# Patient Record
Sex: Female | Born: 1990 | Race: White | Hispanic: No | Smoking: Never smoker
Health system: Southern US, Community
[De-identification: ages and names within clinical notes are randomized; demographics above are authoritative.]

## PROBLEM LIST (undated history)

## (undated) DIAGNOSIS — T4145XA Adverse effect of unspecified anesthetic, initial encounter: Secondary | ICD-10-CM

## (undated) DIAGNOSIS — G43909 Migraine, unspecified, not intractable, without status migrainosus: Secondary | ICD-10-CM

## (undated) DIAGNOSIS — K760 Fatty (change of) liver, not elsewhere classified: Secondary | ICD-10-CM

## (undated) HISTORY — PX: CLAVICLE SURGERY: SHX598

---
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
## 2018-06-24 ENCOUNTER — Emergency Department (HOSPITAL_COMMUNITY)
Admission: EM | Admit: 2018-06-24 | Discharge: 2018-06-24 | Disposition: A | Discharge status: Home / Self Care | Payer: BLUE CROSS/BLUE SHIELD | Attending: Emergency Medicine | Admitting: Emergency Medicine

## 2018-06-24 ENCOUNTER — Emergency Department (HOSPITAL_COMMUNITY): Payer: BLUE CROSS/BLUE SHIELD

## 2018-06-24 ENCOUNTER — Other Ambulatory Visit: Payer: Self-pay

## 2018-06-24 ENCOUNTER — Encounter (HOSPITAL_COMMUNITY): Payer: Self-pay

## 2018-06-24 DIAGNOSIS — Z79899 Other long term (current) drug therapy: Secondary | ICD-10-CM | POA: Diagnosis not present

## 2018-06-24 DIAGNOSIS — K920 Hematemesis: Secondary | ICD-10-CM | POA: Diagnosis present

## 2018-06-24 HISTORY — DX: Fatty (change of) liver, not elsewhere classified: K76.0

## 2018-06-24 HISTORY — DX: Migraine, unspecified, not intractable, without status migrainosus: G43.909

## 2018-06-24 LAB — CBC
HCT: 44.7 % (ref 36.0–46.0)
Hemoglobin: 14.7 g/dL (ref 12.0–15.0)
MCH: 32.4 pg (ref 26.0–34.0)
MCHC: 32.9 g/dL (ref 30.0–36.0)
MCV: 98.5 fL (ref 80.0–100.0)
Platelets: 297 10*3/uL (ref 150–400)
RBC: 4.54 MIL/uL (ref 3.87–5.11)
RDW: 11.9 % (ref 11.5–15.5)
WBC: 14.2 10*3/uL — ABNORMAL HIGH (ref 4.0–10.5)
nRBC: 0 % (ref 0.0–0.2)

## 2018-06-24 LAB — COMPREHENSIVE METABOLIC PANEL
ALT: 17 U/L (ref 0–44)
ANION GAP: 7 (ref 5–15)
AST: 17 U/L (ref 15–41)
Albumin: 4 g/dL (ref 3.5–5.0)
Alkaline Phosphatase: 46 U/L (ref 38–126)
BUN: 10 mg/dL (ref 6–20)
CO2: 28 mmol/L (ref 22–32)
Calcium: 9.3 mg/dL (ref 8.9–10.3)
Chloride: 107 mmol/L (ref 98–111)
Creatinine, Ser: 0.61 mg/dL (ref 0.44–1.00)
GFR calc Af Amer: >60 mL/min (ref >60)
GLUCOSE: 88 mg/dL (ref 70–99)
Potassium: 4.2 mmol/L (ref 3.5–5.1)
Sodium: 142 mmol/L (ref 135–145)
TOTAL PROTEIN: 7.4 g/dL (ref 6.5–8.1)
Total Bilirubin: 0.5 mg/dL (ref 0.3–1.2)

## 2018-06-24 LAB — TYPE AND SCREEN
ABO/RH(D): A POS
Antibody Screen: NEGATIVE

## 2018-06-24 LAB — I-STAT BETA HCG BLOOD, ED (MC, WL, AP ONLY): I-stat hCG, quantitative: <5 m[IU]/mL (ref <5)

## 2018-06-24 MED ORDER — IOPAMIDOL (ISOVUE-300) INJECTION 61%
INTRAVENOUS | Status: AC
  Filled 2018-06-24: qty 100

## 2018-06-24 MED ORDER — SODIUM CHLORIDE (PF) 0.9 % IJ SOLN
INTRAMUSCULAR | Status: AC
  Filled 2018-06-24: qty 50

## 2018-06-24 MED ORDER — IOPAMIDOL (ISOVUE-300) INJECTION 61%
100.0000 mL | Freq: Once | INTRAVENOUS | Status: AC | PRN
  Administered 2018-06-24: 100 mL via INTRAVENOUS

## 2018-06-24 MED ORDER — ESOMEPRAZOLE MAGNESIUM 20 MG PO CPDR: 20.0000 mg | DELAYED_RELEASE_CAPSULE | Freq: Every day | ORAL | 0 refills | Status: DC

## 2018-06-24 NOTE — ED Triage Notes (Signed)
Pt has hx of fatty liver, when she would cough up blood on and off.  Pt states the last few months, she has had approx 3x week where she would throw up. Pt states that she has had blood in her emesis, but doesn't know if it's from her esophagus or from somewhere else.

## 2018-06-24 NOTE — Discharge Instructions (Addendum)
I have provided medication to help with your symptoms, please take one tablet daily for the next 30 days. I have also provided a referral to a gastroenterologist, please schedule an appointment with them at your earliest convenience.

## 2018-06-24 NOTE — ED Provider Notes (Signed)
[facility]-EMERGENCY DEPT Provider Note   CSN: [id number] Arrival date & time: 06/24/18  1703     History   Chief Complaint Chief Complaint  Patient presents with  . Hematemesis    HPI Sara Frost is a 27 y.o. female.  27 y.o female with a PMH of fatty liver presents to the ED with a chief complaint of hematemesis x 3 months. She reports she has a previous history of fatty liver was diagnosed at 89 but states she was seen multiple physicians and only getting pain medication to help with her symptoms.  She recently relocated to [facility] and obtain insurance therefore she decided it would be a good idea to come to the ED to get evaluated and further referral to a PCP.  She reports some lower quadrant abdominal pain radiating to her umbilical region, she reports it as pulling on her abdomen.  Not taking any therapy for this pain.  Also reports vomiting blood for the past couple months and now stating she feels burning sensation on her esophagus and does not know if she has ruptured it.  Denies any chest pain, shortness of breath, fever, headache.     Past Medical History:  Diagnosis Date  . Fatty liver   . Migraines     There are no active problems to display for this patient.   Past Surgical History:  Procedure Laterality Date  . CLAVICLE SURGERY       OB History   None      Home Medications    Prior to Admission medications   Medication Sig Start Date End Date Taking? Authorizing Provider  loratadine (CLARITIN) 10 MG tablet Take 10 mg by mouth daily as needed for allergies.   Yes [provider]  esomeprazole (NEXIUM) 20 MG capsule Take 1 capsule (20 mg total) by mouth daily. 06/24/18 07/24/18  Claude Manges, PA-C    Family History No family history on file.  Social History Social History   Tobacco Use  . Smoking status: Never Smoker  . Smokeless tobacco: Never Used  Substance Use Topics  . Alcohol use: Never    Frequency:  Never  . Drug use: Yes    Types: Marijuana     Allergies   Penicillins and Vicodin [hydrocodone-acetaminophen]   Review of Systems Review of Systems  Constitutional: Negative for fever.  HENT: Negative for sore throat.   Respiratory: Negative for shortness of breath.   Cardiovascular: Negative for chest pain.  Gastrointestinal: Positive for abdominal pain and vomiting. Negative for blood in stool.  Genitourinary: Negative for dysuria and flank pain.  Musculoskeletal: Negative for back pain.  Skin: Negative for pallor and wound.  Neurological: Negative for light-headedness and headaches.     Physical Exam Updated Vital Signs BP (!) 140/94   Pulse 72   Temp 97.8 F (36.6 C) (Oral)   Resp 18   Ht 5\' 8"  (1.727 m)   Wt 79.4 kg   LMP 06/14/2018   SpO2 100%   BMI 26.61 kg/m   Physical Exam  Constitutional: She is oriented to person, place, and time. She appears well-developed and well-nourished.  Neck: Normal range of motion. Neck supple.  Cardiovascular: Normal heart sounds.  Pulmonary/Chest: Effort normal.  Abdominal: Soft.  Neurological: She is alert and oriented to person, place, and time.  Skin: Skin is warm and dry.  Nursing note and vitals reviewed.    ED Treatments / Results  Labs (all labs ordered are listed, but only  abnormal results are displayed) Labs Reviewed  CBC - Abnormal; Notable for the following components:      Result Value   WBC 14.2 (*)    All other components within normal limits  COMPREHENSIVE METABOLIC PANEL  LIPASE, BLOOD  URINALYSIS, ROUTINE W REFLEX MICROSCOPIC  I-STAT BETA HCG BLOOD, ED ([facility], [facility], [facility] ONLY)  POC OCCULT BLOOD, ED  TYPE AND SCREEN  ABO/RH    EKG None  Radiology Ct Abdomen Pelvis W Contrast  Result Date: 06/24/2018 CLINICAL DATA:  Abdominal pain, acute, generalized.  Hematemesis. EXAM: CT ABDOMEN AND PELVIS WITH CONTRAST TECHNIQUE: Multidetector CT imaging of the abdomen and pelvis was performed using the  standard protocol following bolus administration of intravenous contrast. CONTRAST:  ISOVUE-300 IOPAMIDOL (ISOVUE-300) INJECTION 61% COMPARISON:  None. FINDINGS: Lower chest: The lung bases are clear without focal nodule, mass, or airspace disease. The heart size is normal. No significant pleural or pericardial effusion is present. Hepatobiliary: No focal liver abnormality is seen. No gallstones, gallbladder wall thickening, or biliary dilatation. Pancreas: Unremarkable. No pancreatic ductal dilatation or surrounding inflammatory changes. Spleen: Normal in size without focal abnormality. Adrenals/Urinary Tract: Adrenal glands are normal. Kidneys and ureters are within normal limits. There is no stone or mass lesion. The urinary bladder is within normal limits. Stomach/Bowel: Stomach and duodenum are within normal limits. The small bowel is unremarkable. Terminal ileum is within normal limits. The appendix is visualized and normal. The ascending and transverse colon are normal. Descending and sigmoid colon are within normal limits. Vascular/Lymphatic: No significant vascular findings are present. No enlarged abdominal or pelvic lymph nodes. Reproductive: Fluid or edema is present within the endometrial canal, likely physiologic. Uterus and adnexa are within normal limits. Other: Minimal free fluid is likely physiologic. No ventral hernia is present. Musculoskeletal: Vertebral body heights and alignment are normal in the lumbar spine. No acute or focal lesion is evident. Pelvis is within normal limits. The hips are located and normal. IMPRESSION: 1. Negative CT of the abdomen pelvis. No acute or focal lesion to explain patient's abdominal pain or hematemesis. Electronically Signed   By: Marin Roberts M.D.   On: 06/24/2018 22:29    Procedures Procedures (including critical care time)  Medications Ordered in ED Medications  iopamidol (ISOVUE-300) 61 % injection (has no administration in time range)    sodium chloride (PF) 0.9 % injection (has no administration in time range)  iopamidol (ISOVUE-300) 61 % injection 100 mL (100 mLs Intravenous Contrast Given 06/24/18 2208)     Initial Impression / Assessment and Plan / ED Course  I have reviewed the triage vital signs and the nursing notes.  Pertinent labs & imaging results that were available during my care of the patient were reviewed by me and considered in my medical decision making (see chart for details).    Presents with hematemesis for the past 3 months.  She reports she had a history of fatty liver she was 27 years old.  Today she reports no pain but dates she recently moved here from [place] and would like to set up primary care with gastroenterology follow-up.  She has not tried taking anything for her pain.  During examination patient is not distressed, she is texting and is not actively vomiting, no blood noted during exam. Blood work in the ED was unremarkable CMP showed no electrolyte abnormality, AST and ALT are within normal range low suspicion for any liver, gallbladder pathology.  CBC shows slight increase in her blood cells, consistent  with leukocytosis.  Lipase order, UA was not collected the patient denies any urinary symptoms, dysuria, hematuria.  Vitals have been stable during ED visit.  Due to patient's request I provided her with some fluids along with a CT abdomen and pelvis with contrast, this CT showed no acute abnormality, no gallbladder pathology, appendicitis, diverticulitis.  At this point have advised patient that I have no reasoning for her symptoms but I will provide her with a referral to gastroenterology to further evaluate her symptoms.  I have also given her some Nexium to take in order to help with the burning sensation while she eats.  She is vitals stable during ED visit, patient stable for discharge.  Final Clinical Impressions(s) / ED Diagnoses   Final diagnoses:  Hematemesis with nausea    ED  Discharge Orders         Ordered    esomeprazole (NEXIUM) 20 MG capsule  Daily     06/24/18 2240           Claude Manges, PA-C 06/24/18 2244    Charlynne Pander, MD 06/24/18 [phone]

## 2018-06-25 LAB — ABO/RH: ABO/RH(D): A POS

## 2018-06-29 ENCOUNTER — Encounter: Payer: Self-pay | Admitting: Gastroenterology

## 2018-07-23 ENCOUNTER — Encounter: Payer: Self-pay | Admitting: Gastroenterology

## 2018-07-23 ENCOUNTER — Ambulatory Visit: Payer: BLUE CROSS/BLUE SHIELD | Admitting: Gastroenterology

## 2018-07-23 VITALS — BP 140/80 | HR 86 | Ht 68.0 in | Wt 187.0 lb

## 2018-07-23 DIAGNOSIS — R112 Nausea with vomiting, unspecified: Secondary | ICD-10-CM | POA: Diagnosis not present

## 2018-07-23 DIAGNOSIS — K920 Hematemesis: Secondary | ICD-10-CM | POA: Diagnosis not present

## 2018-07-23 DIAGNOSIS — R1033 Periumbilical pain: Secondary | ICD-10-CM

## 2018-07-23 MED ORDER — ONDANSETRON HCL 4 MG PO TABS: 4.0000 mg | ORAL_TABLET | Freq: Three times a day (TID) | ORAL | 2 refills | Status: DC | PRN
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
## 2018-07-23 NOTE — Patient Instructions (Signed)
If you are age 565 or older, your body mass index should be between 23-30. Your Body mass index is 28.43 kg/m. If this is out of the aforementioned range listed, please consider follow up with your Primary Care Provider.  If you are age 25 or younger, your body mass index should be between 19-25. Your Body mass index is 28.43 kg/m. If this is out of the aformentioned range listed, please consider follow up with your Primary Care Provider.   You have been scheduled for an endoscopy. Please follow written instructions given to you at your visit today. If you use inhalers (even only as needed), please bring them with you on the day of your procedure. Your physician has requested that you go to www.startemmi.com and enter the access code given to you at your visit today. This web site gives a general overview about your procedure. However, you should still follow specific instructions given to you by our office regarding your preparation for the procedure.  It was a pleasure to see you today!  Dr. Myrtie Neitheranis

## 2018-08-04 ENCOUNTER — Encounter: Payer: Self-pay | Admitting: Gastroenterology

## 2018-08-04 ENCOUNTER — Ambulatory Visit (AMBULATORY_SURGERY_CENTER): Payer: BLUE CROSS/BLUE SHIELD | Admitting: Gastroenterology

## 2018-08-04 VITALS — BP 112/65 | HR 66 | Temp 97.8°F | Resp 17 | Ht 68.0 in | Wt 187.0 lb

## 2018-08-04 DIAGNOSIS — R112 Nausea with vomiting, unspecified: Secondary | ICD-10-CM

## 2018-08-04 DIAGNOSIS — K2970 Gastritis, unspecified, without bleeding: Secondary | ICD-10-CM | POA: Diagnosis present

## 2018-08-04 DIAGNOSIS — K2950 Unspecified chronic gastritis without bleeding: Secondary | ICD-10-CM

## 2018-08-04 MED ORDER — SODIUM CHLORIDE 0.9 % IV SOLN: 500.0000 mL | Freq: Once | INTRAVENOUS | Status: DC

## 2018-08-04 NOTE — Op Note (Signed)
[facility] Patient Name: Sara Frost Procedure Date: 08/04/2018 9:23 AM MRN: [id number] Endoscopist: Sherilyn Cooter L. Myrtie Neither , MD Age: 27 Referring MD:  Date of Birth: [birth year] Gender: Female Account #: [id number] Procedure:                Upper GI endoscopy Indications:              Persistent vomiting (cyclical for 10 years) Medicines:                Monitored Anesthesia Care Procedure:                Pre-Anesthesia Assessment:                           - Prior to the procedure, a History and Physical                            was performed, and patient medications and                            allergies were reviewed. The patient's tolerance of                            previous anesthesia was also reviewed. The risks                            and benefits of the procedure and the sedation                            options and risks were discussed with the patient.                            All questions were answered, and informed consent                            was obtained. Prior Anticoagulants: The patient has                            taken no previous anticoagulant or antiplatelet                            agents. ASA Grade Assessment: I - A normal, healthy                            patient. After reviewing the risks and benefits,                            the patient was deemed in satisfactory condition to                            undergo the procedure.                           After obtaining informed consent, the endoscope was  passed under direct vision. Throughout the                            procedure, the patient's blood pressure, pulse, and                            oxygen saturations were monitored continuously. The                            Endoscope was introduced through the mouth, and                            advanced to the second part of duodenum. The upper                            GI endoscopy was  accomplished without difficulty.                            The patient tolerated the procedure well. Scope In: Scope Out: Findings:                 LA Grade A (one or more mucosal breaks less than 5                            mm, not extending between tops of 2 mucosal folds)                            esophagitis was found at the gastroesophageal                            junction.                           The entire examined stomach was normal. Biopsies                            were taken with a cold forceps for histology.                            (Sidney protocol).                           The cardia and gastric fundus were normal on                            retroflexion.                           The examined duodenum was normal. Complications:            No immediate complications. Estimated Blood Loss:     Estimated blood loss was minimal. Impression:               - LA Grade A esophagitis (result of vomiting)                           -  Normal stomach. Biopsied.                           - Normal examined duodenum.                           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 Recommendation:           - Patient has a contact number available for                            emergencies. The signs and symptoms of potential                            delayed complications were discussed with the                            patient. Return to normal activities tomorrow.                            Written discharge instructions were provided to the                            patient.                           - Resume previous diet.                           - Continue present medications.                           - Await pathology results.                           - Zofran was recently prescribed - patient is                            considering a trial of it. [name], MD 08/04/2018 9:45:00 AM This  report has been signed electronically.

## 2018-08-04 NOTE — Patient Instructions (Signed)
  Await pathology results.  Trial of Zofran to be considered.  YOU HAD AN ENDOSCOPIC PROCEDURE TODAY AT THE [facility]:   Refer to the procedure report that was given to you for any specific questions about what was found during the examination.  If the procedure report does not answer your questions, please call your gastroenterologist to clarify.  If you requested that your care partner not be given the details of your procedure findings, then the procedure report has been included in a sealed envelope for you to review at your convenience later.  YOU SHOULD EXPECT: Some feelings of bloating in the abdomen. Passage of more gas than usual.  Walking can help get rid of the air that was put into your GI tract during the procedure and reduce the bloating. If you had a lower endoscopy (such as a colonoscopy or flexible sigmoidoscopy) you may notice spotting of blood in your stool or on the toilet paper. If you underwent a bowel prep for your procedure, you may not have a normal bowel movement for a few days.  Please Note:  You might notice some irritation and congestion in your nose or some drainage.  This is from the oxygen used during your procedure.  There is no need for concern and it should clear up in a day or so.  SYMPTOMS TO REPORT IMMEDIATELY:     Following upper endoscopy (EGD)  Vomiting of blood or coffee ground material  New chest pain or pain under the shoulder blades  Painful or persistently difficult swallowing  New shortness of breath  Fever of 100F or higher  Black, tarry-looking stools  For urgent or emergent issues, a gastroenterologist can be reached at any hour by calling [phone].   DIET:  We do recommend a small meal at first, but then you may proceed to your regular diet.  Drink plenty of fluids but you should avoid alcoholic beverages for 24 hours.  ACTIVITY:  You should plan to take it easy for the rest of today and you should NOT DRIVE or use  heavy machinery until tomorrow (because of the sedation medicines used during the test).    FOLLOW UP: Our staff will call the number listed on your records the next business day following your procedure to check on you and address any questions or concerns that you may have regarding the information given to you following your procedure. If we do not reach you, we will leave a message.  However, if you are feeling well and you are not experiencing any problems, there is no need to return our call.  We will assume that you have returned to your regular daily activities without incident.  If any biopsies were taken you will be contacted by phone or by letter within the next 1-3 weeks.  Please call us at [phone] if you have not heard about the biopsies in 3 weeks.    SIGNATURES/CONFIDENTIALITY: You and/or your care partner have signed paperwork which will be entered into your electronic medical record.  These signatures attest to the fact that that the information above on your After Visit Summary has been reviewed and is understood.  Full responsibility of the confidentiality of this discharge information lies with you and/or your care-partner.

## 2018-08-04 NOTE — Progress Notes (Signed)
Pt's states no medical or surgical changes since previsit or office visit. 

## 2018-08-04 NOTE — Progress Notes (Signed)
Pt awake VSS report to RN, no anesthetic complications noted

## 2018-08-05 ENCOUNTER — Telehealth: Payer: Self-pay | Admitting: *Deleted

## 2018-08-05 ENCOUNTER — Telehealth: Payer: Self-pay

## 2018-08-05 NOTE — Telephone Encounter (Signed)
Second phone call attempt, no answer, unable to leave message.

## 2018-09-08 ENCOUNTER — Ambulatory Visit: Payer: BLUE CROSS/BLUE SHIELD | Admitting: Gastroenterology

## 2018-09-08 ENCOUNTER — Encounter: Payer: Self-pay | Admitting: Gastroenterology

## 2018-09-08 VITALS — BP 116/70 | HR 72 | Ht 72.0 in | Wt 178.0 lb

## 2018-09-08 DIAGNOSIS — R1010 Upper abdominal pain, unspecified: Secondary | ICD-10-CM | POA: Diagnosis not present

## 2018-09-08 DIAGNOSIS — R112 Nausea with vomiting, unspecified: Secondary | ICD-10-CM | POA: Diagnosis not present

## 2018-09-08 NOTE — Progress Notes (Deleted)
     [facility] Progress Note  Chief Complaint: Nausea and vomiting  Subjective  History:  Lidia recently came to see me for many years of cyclic vomiting.  Recent upper endoscopy was normal, and biopsies negative for H. pylori.  At the time of initial visit, requested a prescription for Zofran since this had well at times in the past.  ROS: Cardiovascular:  no chest pain Respiratory: no dyspnea  The patient's Past Medical, Family and Social History were reviewed and are on file in the EMR.  Objective:  Med list reviewed  Current Outpatient Medications:  .  loratadine (CLARITIN) 10 MG tablet, Take 10 mg by mouth daily as needed for allergies., Disp: , Rfl:  .  ondansetron (ZOFRAN) 4 MG tablet, Take 1 tablet (4 mg total) by mouth every 8 (eight) hours as needed for nausea or vomiting. Please prescribe oral disintegrating tablet, Disp: 45 tablet, Rfl: 2   Vital signs in last 24 hrs: There were no vitals filed for this visit.  Physical Exam  ***  HEENT: sclera anicteric, oral mucosa moist without lesions  Neck: supple, no thyromegaly, JVD or lymphadenopathy  Cardiac: RRR without murmurs, S1S2 heard, no peripheral edema  Pulm: clear to auscultation bilaterally, normal RR and effort noted  Abdomen: soft, *** tenderness, with active bowel sounds. No guarding or palpable hepatosplenomegaly.  Skin; warm and dry, no jaundice or rash  Recent Labs:    Radiologic studies:    @ASSESSMENTPLANBEGIN @ Assessment: No diagnosis found.    Plan:    Total time *** minutes, over half spent face-to-face with patient in counseling and coordination of care.   Charlie Pitter III

## 2018-09-08 NOTE — Progress Notes (Signed)
     [facility] Progress Note  Chief Complaint: Nausea and vomiting  Subjective  History:  Sara Frost follows up after recent upper endoscopy.  I saw her in early December for longstanding episodic nausea and vomiting with upper abdominal pain, most consistent with cyclic vomiting syndrome.  Recent upper endoscopy was normal, H. pylori CLO biopsy negative.  She has been taking Zofran as needed, which helps to relieve the severity duration of episodes, still occur 3-4 times a week.  It seems to most often occur in the morning, which she will vomit several times with crampy upper abdominal pain, much less frequently in the evening.  Between episodes she feels fine with no abdominal pain or nausea.  ROS: Cardiovascular:  no chest pain Respiratory: no dyspnea  The patient's Past Medical, Family and Social History were reviewed and are on file in the EMR.  Objective:  Med list reviewed  Current Outpatient Medications:  .  loratadine (CLARITIN) 10 MG tablet, Take 10 mg by mouth daily as needed for allergies., Disp: , Rfl:  .  ondansetron (ZOFRAN) 4 MG tablet, Take 1 tablet (4 mg total) by mouth every 8 (eight) hours as needed for nausea or vomiting. Please prescribe oral disintegrating tablet, Disp: 45 tablet, Rfl: 2   Vital signs in last 24 hrs: Vitals:   09/08/18 1402  BP: 116/70  Pulse: 72    Physical Exam  Well-appearing  HEENT: sclera anicteric, oral mucosa moist without lesions  Neck: supple, no thyromegaly, JVD or lymphadenopathy  Cardiac: RRR without murmurs, S1S2 heard, no peripheral edema  Pulm: clear to auscultation bilaterally, normal RR and effort noted  Abdomen: soft, no tenderness, with active bowel sounds. No guarding or palpable hepatosplenomegaly.  Recent Labs:  Gastric biopsy negative for H. pylori As noted in initial consult note, she had a CT scan of the abdomen during ED visit for the symptoms in early November, which was a normal study.   Specifically, no areas of inflammation or obstruction were seen.  @ASSESSMENTPLANBEGIN @ Assessment: Encounter Diagnoses  Name Primary?  . Nausea and vomiting in adult Yes  . Upper abdominal pain    This is behaving most like cyclic vomiting syndrome.  We again discussed the unclear nature of that condition and its limited available treatments.  I recommended a trial of metoclopramide 5 mg every morning, with another dose later in the day if needed.  We discussed the possible side effects including tardive dyskinesia.  She wrote down this information and would like to give it some more consideration.  I also again brought up the possibility of consultation at the [facility] GI motility clinic to consider EGG.  If it came to that, they would also require a 4-hour gastric emptying study to be done prior to their clinic visit.  Sara Frost would like to consider it further and I asked her to contact me when she makes a decision.    Total time 20 minutes, over half spent face-to-face with patient in counseling and coordination of care.   Charlie Pitter III

## 2018-09-08 NOTE — Patient Instructions (Addendum)
Please call us when you decide if you would like a trial of metoclopramide.  If you are age 26 or older, your body mass index should be between 23-30. Your Body mass index is 24.14 kg/m. If this is out of the aforementioned range listed, please consider follow up with your Primary Care Provider.  If you are age 8 or younger, your body mass index should be between 19-25. Your Body mass index is 24.14 kg/m. If this is out of the aformentioned range listed, please consider follow up with your Primary Care Provider.   It was a pleasure to see you today!  Dr. Myrtie Neither

## 2018-09-23 ENCOUNTER — Inpatient Hospital Stay (HOSPITAL_COMMUNITY)
Admission: AD | Admit: 2018-09-23 | Discharge: 2018-09-23 | Disposition: A | Discharge status: Home / Self Care | Payer: BLUE CROSS/BLUE SHIELD | Attending: Obstetrics and Gynecology | Admitting: Obstetrics and Gynecology

## 2018-09-23 ENCOUNTER — Encounter (HOSPITAL_COMMUNITY): Payer: Self-pay | Admitting: *Deleted

## 2018-09-23 ENCOUNTER — Other Ambulatory Visit: Payer: Self-pay

## 2018-09-23 DIAGNOSIS — N939 Abnormal uterine and vaginal bleeding, unspecified: Secondary | ICD-10-CM | POA: Diagnosis present

## 2018-09-23 DIAGNOSIS — Z880 Allergy status to penicillin: Secondary | ICD-10-CM | POA: Insufficient documentation

## 2018-09-23 DIAGNOSIS — Z3202 Encounter for pregnancy test, result negative: Secondary | ICD-10-CM | POA: Diagnosis not present

## 2018-09-23 LAB — URINALYSIS, ROUTINE W REFLEX MICROSCOPIC

## 2018-09-23 LAB — CBC
HCT: 42.9 % (ref 36.0–46.0)
Hemoglobin: 14.9 g/dL (ref 12.0–15.0)
MCH: 32.5 pg (ref 26.0–34.0)
MCHC: 34.7 g/dL (ref 30.0–36.0)
MCV: 93.5 fL (ref 80.0–100.0)
Platelets: 239 10*3/uL (ref 150–400)
RBC: 4.59 MIL/uL (ref 3.87–5.11)
RDW: 12.3 % (ref 11.5–15.5)
WBC: 13.3 10*3/uL — ABNORMAL HIGH (ref 4.0–10.5)
nRBC: 0 % (ref 0.0–0.2)

## 2018-09-23 LAB — URINALYSIS, MICROSCOPIC (REFLEX): RBC / HPF: >50 RBC/hpf (ref 0–5)

## 2018-09-23 LAB — POCT PREGNANCY, URINE: Preg Test, Ur: NEGATIVE

## 2018-09-23 LAB — ABO/RH: ABO/RH(D): A POS

## 2018-09-23 LAB — HCG, QUANTITATIVE, PREGNANCY: hCG, Beta Chain, Quant, S: <1 m[IU]/mL (ref <5)

## 2018-09-23 NOTE — MAU Note (Signed)
Took 8 preg test about a month ago and they were all positive.  Started bleeding together. Cramping. Co-workers thought she might be having a miscarriage.

## 2018-09-23 NOTE — Discharge Instructions (Signed)
Abnormal Uterine Bleeding  Abnormal uterine bleeding means bleeding more than usual from your uterus. It can include:   Bleeding between periods.   Bleeding after sex.   Bleeding that is heavier than normal.   Periods that last longer than usual.   Bleeding after you have stopped having your period (menopause).  There are many problems that may cause this. You should see a doctor for any kind of bleeding that is not normal. Treatment depends on the cause of the bleeding.  Follow these instructions at home:   Watch your condition for any changes.   Do not use tampons, douche, or have sex, if your doctor tells you not to.   Change your pads often.   Get regular well-woman exams. Make sure they include a pelvic exam and cervical cancer screening.   Keep all follow-up visits as told by your doctor. This is important.  Contact a doctor if:   The bleeding lasts more than one week.   You feel dizzy at times.   You feel like you are going to throw up (nauseous).   You throw up.  Get help right away if:   You pass out.   You have to change pads every hour.   You have belly (abdominal) pain.   You have a fever.   You get sweaty.   You get weak.   You passing large blood clots from your vagina.  Summary   Abnormal uterine bleeding means bleeding more than usual from your uterus.   There are many problems that may cause this. You should see a doctor for any kind of bleeding that is not normal.   Treatment depends on the cause of the bleeding.  This information is not intended to replace advice given to you by your health care provider. Make sure you discuss any questions you have with your health care provider.  Document Released: 06/01/2009 Document Revised: 07/29/2016 Document Reviewed: 07/29/2016  Elsevier Interactive Patient Education  2019 [employer]

## 2018-09-23 NOTE — MAU Provider Note (Signed)
History     CSN: [id number]  Arrival date and time: 09/23/18 1308   First Provider Initiated Contact with Patient 09/23/18 1340      Chief Complaint  Patient presents with  . Vaginal Bleeding  . Abdominal Pain  . Possible Pregnancy   HPI Sara Frost is a 27 y.o. G0P0 patient who presents to MAU with chief complaint of heavy vaginal bleeding in the setting of multiple home pregnancy tests.  Patient endorses new onset lower abdominal pain at work. She says "the girls at work" saw the bleeding and told her she should probably come to MAU for evaluation.  Patient endorses LMP of 07/02/2018 but also says she had multiple days of spotting in November and at the end of December. She also endorses lower back cramping for the past two weeks. Patient reports intermittent breast tenderness but is not sure of timing in correlation to her menstrual cycle. She states she has never had irregular periods or PMS before now.  OB History   No obstetric history on file.     Past Medical History:  Diagnosis Date  . Fatty liver   . Migraines     Past Surgical History:  Procedure Laterality Date  . CLAVICLE SURGERY      Family History  Problem Relation Age of Onset  . Lung cancer Mother   . Liver cancer Maternal Grandmother   . Pancreatic cancer Paternal Grandfather     Social History   Tobacco Use  . Smoking status: Never Smoker  . Smokeless tobacco: Never Used  Substance Use Topics  . Alcohol use: Never    Frequency: Never  . Drug use: Yes    Types: Marijuana    Comment: last used last night 08/04/18    Allergies:  Allergies  Allergen Reactions  . Penicillins Nausea Only    Has patient had a PCN reaction causing immediate rash, facial/tongue/throat swelling, SOB or lightheadedness with hypotension: Y Has patient had a PCN reaction causing severe rash involving mucus membranes or skin necrosis: Y Has patient had a PCN reaction that required hospitalization: N Has patient  had a PCN reaction occurring within the last 10 years: Y If all of the above answers are "NO", then may proceed with Cephalosporin use.   . Vicodin [Hydrocodone-Acetaminophen] Nausea And Vomiting    Medications Prior to Admission  Medication Sig Dispense Refill Last Dose  . loratadine (CLARITIN) 10 MG tablet Take 10 mg by mouth daily as needed for allergies.   Unknown  . ondansetron (ZOFRAN) 4 MG tablet Take 1 tablet (4 mg total) by mouth every 8 (eight) hours as needed for nausea or vomiting. Please prescribe oral disintegrating tablet 45 tablet 2 Unknown    Review of Systems  Constitutional: Negative for chills, fatigue and fever.  Respiratory: Negative for shortness of breath.   Gastrointestinal: Positive for abdominal pain.  Genitourinary: Positive for vaginal bleeding. Negative for flank pain.  Musculoskeletal: Positive for back pain.  Neurological: Negative for dizziness, weakness and headaches.  All other systems reviewed and are negative.  Physical Exam   Blood pressure (!) 157/69, pulse 69, temperature 98.4 F (36.9 C), temperature source Oral, resp. rate 18, height 5\' 7"  (1.702 m), weight 80.9 kg, last menstrual period 07/19/2018, SpO2 100 %.  Physical Exam  Nursing note and vitals reviewed. Constitutional: She is oriented to person, place, and time. She appears well-developed and well-nourished.  Cardiovascular: Normal rate.  Respiratory: Effort normal. No respiratory distress.  GI: Soft. She exhibits  no distension. There is no abdominal tenderness. There is no rebound and no guarding.  Neurological: She is alert and oriented to person, place, and time.  Skin: Skin is warm and dry.  Psychiatric: She has a normal mood and affect. Her behavior is normal. Judgment and thought content normal.    MAU Course/MDM   --Patient's triage tab from endoscopy evaluation endorse regular period in mid-January --Negligible bleeding in MAU --Discussed with patient that based on her  physical exam, Negative urine pregnancy test and Quant hCG of <1 my suspicion for recent miscarriage is low and I do not feel an inpatient ultrasound is indicated.   Patient Vitals for the past 24 hrs:  BP Temp Temp src Pulse Resp SpO2 Height Weight  09/23/18 1533 (!) 156/89 - - (!) 58 16 - - -  09/23/18 1322 (!) 157/69 98.4 F (36.9 C) Oral 69 18 100 % 5\' 7"  (1.702 m) 80.9 kg    Results for orders placed or performed during the hospital encounter of 09/23/18 (from the past 24 hour(s))  Urinalysis, Routine w reflex microscopic     Status: Abnormal   Collection Time: 09/23/18  1:31 PM  Result Value Ref Range   Color, Urine RED (A) YELLOW   APPearance HAZY (A) CLEAR   Specific Gravity, Urine  1.005 - 1.030    TEST NOT REPORTED DUE TO COLOR INTERFERENCE OF URINE PIGMENT   pH  5.0 - 8.0    TEST NOT REPORTED DUE TO COLOR INTERFERENCE OF URINE PIGMENT   Glucose, UA (A) NEGATIVE mg/dL    TEST NOT REPORTED DUE TO COLOR INTERFERENCE OF URINE PIGMENT   Hgb urine dipstick (A) NEGATIVE    TEST NOT REPORTED DUE TO COLOR INTERFERENCE OF URINE PIGMENT   Bilirubin Urine (A) NEGATIVE    TEST NOT REPORTED DUE TO COLOR INTERFERENCE OF URINE PIGMENT   Ketones, ur (A) NEGATIVE mg/dL    TEST NOT REPORTED DUE TO COLOR INTERFERENCE OF URINE PIGMENT   Protein, ur (A) NEGATIVE mg/dL    TEST NOT REPORTED DUE TO COLOR INTERFERENCE OF URINE PIGMENT   Nitrite (A) NEGATIVE    TEST NOT REPORTED DUE TO COLOR INTERFERENCE OF URINE PIGMENT   Leukocytes, UA (A) NEGATIVE    TEST NOT REPORTED DUE TO COLOR INTERFERENCE OF URINE PIGMENT  Urinalysis, Microscopic (reflex)     Status: Abnormal   Collection Time: 09/23/18  1:31 PM  Result Value Ref Range   RBC / HPF >50 0 - 5 RBC/hpf   WBC, UA 0-5 0 - 5 WBC/hpf   Bacteria, UA MANY (A) NONE SEEN   Squamous Epithelial / LPF 0-5 0 - 5   Mucus PRESENT   Pregnancy, urine POC     Status: None   Collection Time: 09/23/18  1:33 PM  Result Value Ref Range   Preg Test, Ur  NEGATIVE NEGATIVE  CBC     Status: Abnormal   Collection Time: 09/23/18  1:42 PM  Result Value Ref Range   WBC 13.3 (H) 4.0 - 10.5 K/uL   RBC 4.59 3.87 - 5.11 MIL/uL   Hemoglobin 14.9 12.0 - 15.0 g/dL   HCT 25.0 03.7 - 04.8 %   MCV 93.5 80.0 - 100.0 fL   MCH 32.5 26.0 - 34.0 pg   MCHC 34.7 30.0 - 36.0 g/dL   RDW 88.9 16.9 - 45.0 %   Platelets 239 150 - 400 K/uL   nRBC 0.0 0.0 - 0.2 %  hCG, quantitative, pregnancy  Status: None   Collection Time: 09/23/18  1:42 PM  Result Value Ref Range   hCG, Beta Chain, Quant, S <1 <5 mIU/mL  ABO/Rh     Status: None   Collection Time: 09/23/18  1:42 PM  Result Value Ref Range   ABO/RH(D)      A POS Performed at [facility], [address] [place], [place] [id number]      Assessment and Plan  --28 y.o. G0P0, irregular vaginal bleeding --Quant hCG <1 --Discharge home in stable condition  F/U: PRN with PCP or establish GYN care for closer follow up  Calvert CantorSamantha C Helia Haese, CNM 09/23/2018, 3:52 PM

## 2019-02-18 ENCOUNTER — Emergency Department (HOSPITAL_COMMUNITY)
Admission: EM | Admit: 2019-02-18 | Discharge: 2019-02-19 | Disposition: A | Discharge status: Home / Self Care | Payer: Self-pay | Attending: Emergency Medicine | Admitting: Emergency Medicine

## 2019-02-18 ENCOUNTER — Emergency Department (HOSPITAL_COMMUNITY): Payer: Self-pay

## 2019-02-18 ENCOUNTER — Other Ambulatory Visit: Payer: Self-pay

## 2019-02-18 ENCOUNTER — Encounter (HOSPITAL_COMMUNITY): Payer: Self-pay | Admitting: Emergency Medicine

## 2019-02-18 DIAGNOSIS — Z79899 Other long term (current) drug therapy: Secondary | ICD-10-CM | POA: Insufficient documentation

## 2019-02-18 DIAGNOSIS — Y9351 Activity, roller skating (inline) and skateboarding: Secondary | ICD-10-CM | POA: Insufficient documentation

## 2019-02-18 DIAGNOSIS — Y999 Unspecified external cause status: Secondary | ICD-10-CM | POA: Insufficient documentation

## 2019-02-18 DIAGNOSIS — Y929 Unspecified place or not applicable: Secondary | ICD-10-CM | POA: Insufficient documentation

## 2019-02-18 DIAGNOSIS — S52502A Unspecified fracture of the lower end of left radius, initial encounter for closed fracture: Secondary | ICD-10-CM | POA: Insufficient documentation

## 2019-02-18 MED ORDER — OXYCODONE-ACETAMINOPHEN 5-325 MG PO TABS: 1.0000 | ORAL_TABLET | ORAL | 0 refills | Status: DC | PRN

## 2019-02-18 NOTE — ED Triage Notes (Signed)
Patient presents with left wrist pain/swelling injured this evening when she fell while skating , no LOC/ambulatory .

## 2019-02-18 NOTE — ED Provider Notes (Signed)
[facility] EMERGENCY DEPARTMENT Provider Note   CSN: [id number] Arrival date & time: 02/18/19  2204     History   Chief Complaint Chief Complaint  Patient presents with  . Wrist Injury    HPI Sara Frost is a 28 y.o. female.     The history is provided by the patient. No language interpreter was used.  Wrist Injury Location:  Wrist Wrist location:  L wrist Injury: yes   Mechanism of injury: fall   Fall:    Entrapped after fall: no   Pain details:    Quality:  Aching   Severity:  Moderate   Timing:  Constant   Progression:  Worsening Dislocation: no   Relieved by:  Nothing Worsened by:  Nothing Ineffective treatments:  None tried Pt reports she fell while roller skating.  Pt landed with hand out.  Pt complains of pain in her wrist.   Past Medical History:  Diagnosis Date  . Fatty liver   . Migraines     There are no active problems to display for this patient.   Past Surgical History:  Procedure Laterality Date  . CLAVICLE SURGERY       OB History   No obstetric history on file.      Home Medications    Prior to Admission medications   Medication Sig Start Date End Date Taking? Authorizing Provider  loratadine (CLARITIN) 10 MG tablet Take 10 mg by mouth daily as needed for allergies.    [provider]  ondansetron (ZOFRAN) 4 MG tablet Take 1 tablet (4 mg total) by mouth every 8 (eight) hours as needed for nausea or vomiting. Please prescribe oral disintegrating tablet 07/23/18   Doran Stabler, MD    Family History Family History  Problem Relation Age of Onset  . Lung cancer Mother   . Liver cancer Maternal Grandmother   . Pancreatic cancer Paternal Grandfather     Social History Social History   Tobacco Use  . Smoking status: Never Smoker  . Smokeless tobacco: Never Used  Substance Use Topics  . Alcohol use: Never    Frequency: Never  . Drug use: Yes    Types: Marijuana    Comment: last used last  night 08/04/18     Allergies   Penicillins and Vicodin [hydrocodone-acetaminophen]   Review of Systems Review of Systems  All other systems reviewed and are negative.    Physical Exam Updated Vital Signs BP (!) 146/102 (BP Location: Left Arm)   Pulse 62   Temp 98.4 F (36.9 C) (Oral)   Resp 18   LMP 02/04/2019 (Approximate)   SpO2 100%   Physical Exam Vitals signs reviewed.  HENT:     Head: Normocephalic.  Musculoskeletal:        General: Swelling and tenderness present.     Comments: nv and ns intact,  From   Skin:    General: Skin is warm.  Neurological:     General: No focal deficit present.     Mental Status: She is alert.  Psychiatric:        Mood and Affect: Mood normal.      ED Treatments / Results  Labs (all labs ordered are listed, but only abnormal results are displayed) Labs Reviewed - No data to display  EKG None  Radiology Dg Wrist Complete Left  Result Date: 02/18/2019 CLINICAL DATA:  Pain after fall. EXAM: LEFT WRIST - COMPLETE 3+ VIEW COMPARISON:  None. FINDINGS: There  is a fracture through the ulnar styloid. There is a mildly displaced comminuted fracture through the distal radius. No dislocation. Soft tissue swelling is seen over the dorsum of the wrist. No other acute abnormalities. IMPRESSION: Comminuted displaced fracture through the distal radius. Ulnar styloid fracture. Soft tissue swelling. Electronically Signed   By: Gerome Samavid  Williams III M.D   On: 02/18/2019 22:57    Procedures Procedures (including critical care time)  Medications Ordered in ED Medications - No data to display   Initial Impression / Assessment and Plan / ED Course  I have reviewed the triage vital signs and the nursing notes.  Pertinent labs & imaging results that were available during my care of the patient were reviewed by me and considered in my medical decision making (see chart for details).        MDM  Pt placed in a sugar tong splint.  Pt placed in  a sling.  I spoke to Dr. Merlyn LotKuzma who advised he will see this week for evaluation   Final Clinical Impressions(s) / ED Diagnoses   Final diagnoses:  Closed fracture of distal end of left radius, unspecified fracture morphology, initial encounter    ED Discharge Orders         Ordered    oxyCODONE-acetaminophen (PERCOCET) 5-325 MG tablet  Every 4 hours PRN     02/18/19 2359        An After Visit Summary was printed and given to the patient.    Elson AreasSofia, Leslie K, PA-C 02/19/19 0000    Maia PlanLong, Joshua G, MD 02/19/19 1256

## 2019-02-19 MED ORDER — OXYCODONE-ACETAMINOPHEN 5-325 MG PO TABS
1.0000 | ORAL_TABLET | Freq: Once | ORAL | Status: AC
  Administered 2019-02-19: 1 via ORAL
  Filled 2019-02-19: qty 1

## 2019-02-19 NOTE — Progress Notes (Signed)
Orthopedic Tech Progress Note Patient Details:  Sara Frost 08/28/1990 [id number]  Ortho Devices Type of Ortho Device: Short arm splint Ortho Device/Splint Interventions: Adjustment, Application, Ordered   Post Interventions Patient Tolerated: Well Instructions Provided: Care of device, Adjustment of device   Melony Overly T 02/19/2019, 12:11 AM

## 2019-02-22 ENCOUNTER — Other Ambulatory Visit (HOSPITAL_COMMUNITY)
Admission: RE | Admit: 2019-02-22 | Discharge: 2019-02-22 | Disposition: A | Discharge status: Home / Self Care | Payer: HRSA Program | Source: Ambulatory Visit | Attending: Orthopedic Surgery | Admitting: Orthopedic Surgery

## 2019-02-22 ENCOUNTER — Other Ambulatory Visit: Payer: Self-pay | Admitting: Orthopedic Surgery

## 2019-02-22 ENCOUNTER — Other Ambulatory Visit: Payer: Self-pay

## 2019-02-22 ENCOUNTER — Encounter (HOSPITAL_BASED_OUTPATIENT_CLINIC_OR_DEPARTMENT_OTHER): Payer: Self-pay | Admitting: *Deleted

## 2019-02-22 DIAGNOSIS — Z01812 Encounter for preprocedural laboratory examination: Secondary | ICD-10-CM | POA: Diagnosis present

## 2019-02-22 DIAGNOSIS — Z1159 Encounter for screening for other viral diseases: Secondary | ICD-10-CM | POA: Diagnosis not present

## 2019-02-22 LAB — SARS CORONAVIRUS 2 (TAT 6-24 HRS): SARS Coronavirus 2: NEGATIVE

## 2019-02-24 ENCOUNTER — Ambulatory Visit (HOSPITAL_BASED_OUTPATIENT_CLINIC_OR_DEPARTMENT_OTHER)
Admission: RE | Admit: 2019-02-24 | Discharge: 2019-02-24 | Disposition: A | Discharge status: Home / Self Care | Payer: Self-pay | Attending: Orthopedic Surgery | Admitting: Orthopedic Surgery

## 2019-02-24 ENCOUNTER — Ambulatory Visit (HOSPITAL_BASED_OUTPATIENT_CLINIC_OR_DEPARTMENT_OTHER): Payer: Self-pay | Admitting: Anesthesiology

## 2019-02-24 ENCOUNTER — Encounter (HOSPITAL_BASED_OUTPATIENT_CLINIC_OR_DEPARTMENT_OTHER)
Admission: RE | Disposition: A | Discharge status: Home / Self Care | Payer: Self-pay | Source: Home / Self Care | Attending: Orthopedic Surgery

## 2019-02-24 ENCOUNTER — Encounter (HOSPITAL_BASED_OUTPATIENT_CLINIC_OR_DEPARTMENT_OTHER): Payer: Self-pay | Admitting: Anesthesiology

## 2019-02-24 ENCOUNTER — Other Ambulatory Visit: Payer: Self-pay

## 2019-02-24 DIAGNOSIS — S52572A Other intraarticular fracture of lower end of left radius, initial encounter for closed fracture: Secondary | ICD-10-CM | POA: Insufficient documentation

## 2019-02-24 DIAGNOSIS — Y9351 Activity, roller skating (inline) and skateboarding: Secondary | ICD-10-CM | POA: Insufficient documentation

## 2019-02-24 DIAGNOSIS — Z880 Allergy status to penicillin: Secondary | ICD-10-CM | POA: Insufficient documentation

## 2019-02-24 DIAGNOSIS — Z801 Family history of malignant neoplasm of trachea, bronchus and lung: Secondary | ICD-10-CM | POA: Insufficient documentation

## 2019-02-24 DIAGNOSIS — Z800 Family history of malignant neoplasm of digestive organs: Secondary | ICD-10-CM | POA: Insufficient documentation

## 2019-02-24 DIAGNOSIS — G43909 Migraine, unspecified, not intractable, without status migrainosus: Secondary | ICD-10-CM | POA: Insufficient documentation

## 2019-02-24 DIAGNOSIS — Z79899 Other long term (current) drug therapy: Secondary | ICD-10-CM | POA: Insufficient documentation

## 2019-02-24 DIAGNOSIS — Z885 Allergy status to narcotic agent status: Secondary | ICD-10-CM | POA: Insufficient documentation

## 2019-02-24 DIAGNOSIS — K760 Fatty (change of) liver, not elsewhere classified: Secondary | ICD-10-CM | POA: Insufficient documentation

## 2019-02-24 HISTORY — PX: OPEN REDUCTION INTERNAL FIXATION (ORIF) DISTAL RADIAL FRACTURE: SHX5989

## 2019-02-24 LAB — POCT PREGNANCY, URINE: Preg Test, Ur: NEGATIVE

## 2019-02-24 SURGERY — OPEN REDUCTION INTERNAL FIXATION (ORIF) DISTAL RADIUS FRACTURE
Anesthesia: General | Site: Wrist | Laterality: Left

## 2019-02-24 MED ORDER — BUPIVACAINE HCL (PF) 0.5 % IJ SOLN
INTRAMUSCULAR | Status: DC | PRN
  Administered 2019-02-24: 15 mL via PERINEURAL

## 2019-02-24 MED ORDER — LIDOCAINE HCL (CARDIAC) PF 100 MG/5ML IV SOSY
PREFILLED_SYRINGE | INTRAVENOUS | Status: DC | PRN
  Administered 2019-02-24: 100 mg via INTRAVENOUS

## 2019-02-24 MED ORDER — MIDAZOLAM HCL 2 MG/2ML IJ SOLN
INTRAMUSCULAR | Status: AC
  Filled 2019-02-24: qty 2

## 2019-02-24 MED ORDER — MIDAZOLAM HCL 2 MG/2ML IJ SOLN
1.0000 mg | INTRAMUSCULAR | Status: DC | PRN
  Administered 2019-02-24: 10:00:00 2 mg via INTRAVENOUS
  Administered 2019-02-24: 1 mg via INTRAVENOUS

## 2019-02-24 MED ORDER — METOCLOPRAMIDE HCL 5 MG/ML IJ SOLN: 10.0000 mg | Freq: Once | INTRAMUSCULAR | Status: DC | PRN

## 2019-02-24 MED ORDER — DEXAMETHASONE SODIUM PHOSPHATE 4 MG/ML IJ SOLN
INTRAMUSCULAR | Status: DC | PRN
  Administered 2019-02-24: 10 mg via INTRAVENOUS

## 2019-02-24 MED ORDER — PROPOFOL 10 MG/ML IV BOLUS
INTRAVENOUS | Status: DC | PRN
  Administered 2019-02-24: 200 mg via INTRAVENOUS

## 2019-02-24 MED ORDER — ONDANSETRON HCL 4 MG/2ML IJ SOLN
INTRAMUSCULAR | Status: AC
  Filled 2019-02-24: qty 2

## 2019-02-24 MED ORDER — OXYCODONE-ACETAMINOPHEN 5-325 MG PO TABS: ORAL_TABLET | ORAL | 0 refills | Status: DC

## 2019-02-24 MED ORDER — VANCOMYCIN HCL IN DEXTROSE 1-5 GM/200ML-% IV SOLN: 1000.0000 mg | INTRAVENOUS | Status: DC

## 2019-02-24 MED ORDER — DEXAMETHASONE SODIUM PHOSPHATE 10 MG/ML IJ SOLN
INTRAMUSCULAR | Status: AC
  Filled 2019-02-24: qty 3

## 2019-02-24 MED ORDER — FENTANYL CITRATE (PF) 100 MCG/2ML IJ SOLN
INTRAMUSCULAR | Status: AC
  Filled 2019-02-24: qty 2

## 2019-02-24 MED ORDER — FENTANYL CITRATE (PF) 100 MCG/2ML IJ SOLN
50.0000 ug | INTRAMUSCULAR | Status: DC | PRN
  Administered 2019-02-24: 12:00:00 50 ug via INTRAVENOUS
  Administered 2019-02-24: 100 ug via INTRAVENOUS

## 2019-02-24 MED ORDER — SCOPOLAMINE 1 MG/3DAYS TD PT72: 1.0000 | MEDICATED_PATCH | Freq: Once | TRANSDERMAL | Status: DC

## 2019-02-24 MED ORDER — LACTATED RINGERS IV SOLN: INTRAVENOUS | Status: DC

## 2019-02-24 MED ORDER — GLYCOPYRROLATE PF 0.2 MG/ML IJ SOSY
PREFILLED_SYRINGE | INTRAMUSCULAR | Status: DC | PRN
  Administered 2019-02-24: .2 mg via INTRAVENOUS

## 2019-02-24 MED ORDER — BUPIVACAINE LIPOSOME 1.3 % IJ SUSP
INTRAMUSCULAR | Status: DC | PRN
  Administered 2019-02-24: 10 mL

## 2019-02-24 MED ORDER — MEPERIDINE HCL 25 MG/ML IJ SOLN: 6.2500 mg | INTRAMUSCULAR | Status: DC | PRN

## 2019-02-24 MED ORDER — VANCOMYCIN HCL IN DEXTROSE 1-5 GM/200ML-% IV SOLN
INTRAVENOUS | Status: AC
  Filled 2019-02-24: qty 200

## 2019-02-24 MED ORDER — LACTATED RINGERS IV SOLN
INTRAVENOUS | Status: DC
  Administered 2019-02-24: 09:00:00 via INTRAVENOUS

## 2019-02-24 MED ORDER — DEXAMETHASONE SODIUM PHOSPHATE 10 MG/ML IJ SOLN
INTRAMUSCULAR | Status: AC
  Filled 2019-02-24: qty 1

## 2019-02-24 MED ORDER — LIDOCAINE 2% (20 MG/ML) 5 ML SYRINGE
INTRAMUSCULAR | Status: AC
  Filled 2019-02-24: qty 10

## 2019-02-24 MED ORDER — ONDANSETRON HCL 4 MG/2ML IJ SOLN
INTRAMUSCULAR | Status: AC
  Filled 2019-02-24: qty 10

## 2019-02-24 MED ORDER — FENTANYL CITRATE (PF) 100 MCG/2ML IJ SOLN: 25.0000 ug | INTRAMUSCULAR | Status: DC | PRN

## 2019-02-24 MED ORDER — ONDANSETRON HCL 4 MG/2ML IJ SOLN
INTRAMUSCULAR | Status: DC | PRN
  Administered 2019-02-24: 4 mg via INTRAVENOUS

## 2019-02-24 MED ORDER — LIDOCAINE 2% (20 MG/ML) 5 ML SYRINGE
INTRAMUSCULAR | Status: AC
  Filled 2019-02-24: qty 5

## 2019-02-24 MED ORDER — GLYCOPYRROLATE PF 0.2 MG/ML IJ SOSY
PREFILLED_SYRINGE | INTRAMUSCULAR | Status: AC
  Filled 2019-02-24: qty 1

## 2019-02-24 SURGICAL SUPPLY — 66 items
BANDAGE ACE 3X5.8 VEL STRL LF (GAUZE/BANDAGES/DRESSINGS) ×3 IMPLANT
BIT DRILL 2.0 LNG QUCK RELEASE (BIT) IMPLANT
BIT DRILL 2.8 QUICK RELEASE (BIT) IMPLANT
BLADE SURG 15 STRL LF DISP TIS (BLADE) ×2 IMPLANT
BLADE SURG 15 STRL SS (BLADE) ×4
BNDG COHESIVE 3X5 TAN STRL LF (GAUZE/BANDAGES/DRESSINGS) ×4 IMPLANT
BNDG ESMARK 4X9 LF (GAUZE/BANDAGES/DRESSINGS) ×3 IMPLANT
BNDG GAUZE ELAST 4 BULKY (GAUZE/BANDAGES/DRESSINGS) ×3 IMPLANT
BNDG PLASTER X FAST 3X3 WHT LF (CAST SUPPLIES) ×30 IMPLANT
CHLORAPREP W/TINT 26 (MISCELLANEOUS) ×3 IMPLANT
CORD BIPOLAR FORCEPS 12FT (ELECTRODE) ×3 IMPLANT
COVER BACK TABLE REUSABLE LG (DRAPES) ×3 IMPLANT
COVER MAYO STAND REUSABLE (DRAPES) ×3 IMPLANT
COVER WAND RF STERILE (DRAPES) IMPLANT
CUFF TOURN SGL QUICK 18X4 (TOURNIQUET CUFF) IMPLANT
CUFF TOURN SGL QUICK 24 (TOURNIQUET CUFF)
CUFF TRNQT CYL 24X4X16.5-23 (TOURNIQUET CUFF) IMPLANT
DRAPE EXTREMITY T 121X128X90 (DISPOSABLE) ×3 IMPLANT
DRAPE OEC MINIVIEW 54X84 (DRAPES) ×3 IMPLANT
DRAPE SURG 17X23 STRL (DRAPES) ×3 IMPLANT
DRILL 2.0 LNG QUICK RELEASE (BIT) ×3
DRILL 2.8 QUICK RELEASE (BIT) ×3
GAUZE SPONGE 4X4 12PLY STRL (GAUZE/BANDAGES/DRESSINGS) ×3 IMPLANT
GAUZE XEROFORM 1X8 LF (GAUZE/BANDAGES/DRESSINGS) ×3 IMPLANT
GLOVE BIO SURGEON STRL SZ 6.5 (GLOVE) ×1 IMPLANT
GLOVE BIO SURGEON STRL SZ7.5 (GLOVE) ×3 IMPLANT
GLOVE BIO SURGEONS STRL SZ 6.5 (GLOVE) ×1
GLOVE BIOGEL PI IND STRL 7.0 (GLOVE) IMPLANT
GLOVE BIOGEL PI IND STRL 8 (GLOVE) ×1 IMPLANT
GLOVE BIOGEL PI IND STRL 8.5 (GLOVE) IMPLANT
GLOVE BIOGEL PI INDICATOR 7.0 (GLOVE) ×4
GLOVE BIOGEL PI INDICATOR 8 (GLOVE) ×2
GLOVE BIOGEL PI INDICATOR 8.5 (GLOVE) ×2
GLOVE SURG ORTHO 8.0 STRL STRW (GLOVE) ×2 IMPLANT
GOWN STRL REUS W/ TWL LRG LVL3 (GOWN DISPOSABLE) ×1 IMPLANT
GOWN STRL REUS W/TWL LRG LVL3 (GOWN DISPOSABLE) ×2
GOWN STRL REUS W/TWL XL LVL3 (GOWN DISPOSABLE) ×5 IMPLANT
GUIDEWIRE ORTHO 0.054X6 (WIRE) ×6 IMPLANT
NDL HYPO 25X1 1.5 SAFETY (NEEDLE) IMPLANT
NEEDLE HYPO 25X1 1.5 SAFETY (NEEDLE) IMPLANT
NS IRRIG 1000ML POUR BTL (IV SOLUTION) ×3 IMPLANT
PACK BASIN DAY SURGERY FS (CUSTOM PROCEDURE TRAY) ×3 IMPLANT
PAD CAST 3X4 CTTN HI CHSV (CAST SUPPLIES) ×1 IMPLANT
PADDING CAST COTTON 3X4 STRL (CAST SUPPLIES) ×2
PLATE ACU LOC PROX STD LEFT (Plate) ×2 IMPLANT
SCREW CORT FT 18X2.3XLCK HEX (Screw) IMPLANT
SCREW CORTICAL LOCKING 2.3X16M (Screw) ×2 IMPLANT
SCREW CORTICAL LOCKING 2.3X18M (Screw) ×8 IMPLANT
SCREW CORTICAL LOCKING 2.3X20M (Screw) ×2 IMPLANT
SCREW FX16X2.3XLCK SMTH NS CRT (Screw) IMPLANT
SCREW FX18X2.3XSMTH LCK NS CRT (Screw) IMPLANT
SCREW FX20X2.3XSMTH LCK NS CRT (Screw) IMPLANT
SCREW HEX 3.5X15 NLCKG STRL (Screw) IMPLANT
SCREW HEX 3.5X15MM (Screw) ×3 IMPLANT
SCREW HEXALOBE NON-LOCK 3.5X14 (Screw) ×2 IMPLANT
SCREW NLCKG 13 3.5X13 HEXA (Screw) IMPLANT
SCREW NON TOGG 2.3X20MM (Screw) ×2 IMPLANT
SCREW NON-LOCK 3.5X13 (Screw) ×3 IMPLANT
SLEEVE SCD COMPRESS KNEE MED (MISCELLANEOUS) ×2 IMPLANT
STOCKINETTE 4X48 STRL (DRAPES) ×3 IMPLANT
SUT ETHILON 4 0 PS 2 18 (SUTURE) ×3 IMPLANT
SUT VICRYL 4-0 PS2 18IN ABS (SUTURE) ×3 IMPLANT
SYR BULB 3OZ (MISCELLANEOUS) ×3 IMPLANT
SYR CONTROL 10ML LL (SYRINGE) IMPLANT
TOWEL GREEN STERILE FF (TOWEL DISPOSABLE) ×6 IMPLANT
UNDERPAD 30X30 (UNDERPADS AND DIAPERS) ×3 IMPLANT

## 2019-02-24 NOTE — Op Note (Addendum)
02/24/2019 [facility]  Operative Note  Pre Op Diagnosis: Left comminuted intraarticular distal radius fracture  Post Op Diagnosis: Left comminuted intraarticular distal radius fracture  Procedure:  1. ORIF Left comminuted intraarticular distal radius fracture, 2 intraarticular fragments 2. Left brachioradialis release  Surgeon: Leanora Cover, MD  Assistant: Daryll Brod, MD  Anesthesia: General with regional  Fluids: Per anesthesia flow sheet  EBL: minimal  Complications: None  Specimen: None  Tourniquet Time:  Total Tourniquet Time Documented: Upper Arm (Left) - 53 minutes Total: Upper Arm (Left) - 53 minutes   Disposition: Stable to PACU  INDICATIONS:  Sara Frost is a 28 y.o. female states she fell roller skating one week ago injuring left wrist.  Seen in ED where XR revealed left distal radius fracture with intraarticular extension.  Splinted and followed up in the office.  We discussed nonoperative and operative treatment options.  She wished to proceed with operative fixation.  Risks, benefits, and alternatives of surgery were discussed including the risk of blood loss; infection; damage to nerves, vessels, tendons, ligaments, bone; failure of surgery; need for additional surgery; complications with wound healing; continued pain; nonunion; malunion; stiffness.  We also discussed the possible need for bone graft and the benefits and risks including the possibility of disease transmission.  She voiced understanding of these risks and elected to proceed.    OPERATIVE COURSE:  After being identified preoperatively by myself, the patient and I agreed upon the procedure and site of procedure.  Surgical site was marked.  The risks, benefits and alternatives of the surgery were reviewed and she wished to proceed.  Surgical consent had been signed.  She was given IV Ancef as preoperative antibiotic prophylaxis.  She was transferred to the operating room and placed on  the operating room table in supine position with the Left upper extremity on an armboard. General anesthesia was induced by the anesthesiologist.  A regional block had been performed by anesthesia in preoperative holding.  The Left upper extremity was prepped and draped in normal sterile orthopedic fashion.  A surgical pause was performed between the surgeons, anesthesia and operating room staff, and all were in agreement as to the patient, procedure and site of procedure.  Tourniquet at the proximal aspect of the extremity was inflated to 250 mmHg after exsanguination of the limb with an Esmarch bandage.  Standard volar Mallie Mussel approach was used.  The bipolar electrocautery was used to obtain hemostasis.  The superficial and deep portions of the FCR tendon sheath were incised, and the FCR and FPL were swept ulnarly to protect the palmar cutaneous branch of the median nerve.  The brachioradialis was released at the radial side of the radius.  The pronator quadratus was released and elevated with the periosteal elevator.  The fracture site was identified and cleared of soft tissue interposition and hematoma.  It was reduced under direct visualization.  An AcuMed volar distal radial locking plate was selected.  It was secured to the bone with the guidepins.  C-arm was used in AP and lateral projections to ensure appropriate reduction and position of the hardware and adjustments made as necessary.  Standard AO drilling and measuring technique was used.  A single screw was placed in the slotted hole in the shaft of the plate.  The distal holes were filled with locking pegs with the exception of the styloid holes, which were filled with locking screws.  The remaining holes in the shaft of the plate were filled with  nonlocking screws.  Good purchase was obtained.  C-arm was used in AP, lateral and oblique projections to ensure appropriate reduction and position of hardware, which was the case.  There was no intra-articular  penetration of hardware.  The wound was copiously irrigated with sterile saline.  Pronator quadratus was repaired back over top of the plate using 4-0 Vicryl suture.  Vicryl suture was placed in the subcutaneous tissues in an inverted interrupted fashion and the skin was closed with 4-0 nylon in a horizontal mattress fashion.  There was good pronation and supination of the wrist without crepitance.  The wound was then dressed with sterile Xeroform, 4x4s, and wrapped with a Kerlix bandage.  A volar splint was placed and wrapped with Kerlix and Ace bandage.  Tourniquet was deflated at 53 minutes.  Fingertips were pink with brisk capillary refill after deflation of the tourniquet.  Operative drapes were broken down.  The patient was awoken from anesthesia safely.  She was transferred back to the stretcher and taken to the PACU in stable condition.  I will see her back in the office in one week for postoperative followup.  I will give her a prescription for Percocet 5/325 1-2 tabs PO q6 hours prn pain, dispense # 30.    Betha LoaKevin Valissa Lyvers, MD Electronically signed, 02/24/19

## 2019-02-24 NOTE — H&P (Signed)
  Sara Frost is an 28 y.o. female.   Chief Complaint: left wrist fracture HPI: 28 yo female state she injured left wrist while roller skating 1 week ago.  Seen in ED where XR revealed distal radius fracture.  Splinted and followed up.  She wishes to proceed with operative fixation.  Allergies:  Allergies  Allergen Reactions  . Penicillins Nausea Only    Has patient had a PCN reaction causing immediate rash, facial/tongue/throat swelling, SOB or lightheadedness with hypotension: Y Has patient had a PCN reaction causing severe rash involving mucus membranes or skin necrosis: Y Has patient had a PCN reaction that required hospitalization: N Has patient had a PCN reaction occurring within the last 10 years: Y If all of the above answers are "NO", then may proceed with Cephalosporin use.   . Vicodin [Hydrocodone-Acetaminophen] Nausea And Vomiting    Past Medical History:  Diagnosis Date  . Complication of anesthesia    Pt states that she had nerve damage following the block she was given for clavicle repair   . Fatty liver   . Migraines     Past Surgical History:  Procedure Laterality Date  . CLAVICLE SURGERY      Family History: Family History  Problem Relation Age of Onset  . Lung cancer Mother   . Liver cancer Maternal Grandmother   . Pancreatic cancer Paternal Grandfather     Social History:   reports that she has never smoked. She has never used smokeless tobacco. She reports current drug use. Drug: Marijuana. She reports that she does not drink alcohol.  Medications: Medications Prior to Admission  Medication Sig Dispense Refill  . oxyCODONE-acetaminophen (PERCOCET) 5-325 MG tablet Take 1 tablet by mouth every 4 (four) hours as needed for severe pain. 12 tablet 0  . loratadine (CLARITIN) 10 MG tablet Take 10 mg by mouth daily as needed for allergies.    Marland Kitchen ondansetron (ZOFRAN) 4 MG tablet Take 1 tablet (4 mg total) by mouth every 8 (eight) hours as needed for nausea  or vomiting. Please prescribe oral disintegrating tablet 45 tablet 2    Results for orders placed or performed during the hospital encounter of 02/24/19 (from the past 48 hour(s))  Pregnancy, urine POC     Status: None   Collection Time: 02/24/19  9:22 AM  Result Value Ref Range   Preg Test, Ur NEGATIVE NEGATIVE    Comment:        THE SENSITIVITY OF THIS METHODOLOGY IS >24 mIU/mL     No results found.   A comprehensive review of systems was negative.  Blood pressure 118/65, pulse (!) 44, temperature 97.8 F (36.6 C), temperature source Oral, resp. rate 11, height 5\' 7"  (1.702 m), weight 77.5 kg, last menstrual period 02/04/2019, SpO2 100 %.  General appearance: alert, cooperative and appears stated age Head: Normocephalic, without obvious abnormality, atraumatic Neck: supple, symmetrical, trachea midline Cardio: regular rate and rhythm Resp: clear to auscultation bilaterally Extremities: Intact sensation and capillary refill all digits.  +epl/fpl/io.  No wounds.  Pulses: 2+ and symmetric Skin: Skin color, texture, turgor normal. No rashes or lesions Neurologic: Grossly normal Incision/Wound: none  Assessment/Plan Left distal radius fracture.  Non operative and operative treatment options have been discussed with the patient and patient wishes to proceed with operative treatment. Risks, benefits, and alternatives of surgery have been discussed and the patient agrees with the plan of care.   Leanora Cover 02/24/2019, 11:27 AM

## 2019-02-24 NOTE — Anesthesia Procedure Notes (Signed)
Anesthesia Regional Block: Supraclavicular block   Pre-Anesthetic Checklist: ,, timeout performed, Correct Patient, Correct Site, Correct Laterality, Correct Procedure, Correct Position, site marked, Risks and benefits discussed,  Surgical consent,  Pre-op evaluation,  At surgeon's request and post-op pain management  Laterality: Left and Upper  Prep: Maximum Sterile Barrier Precautions used, chloraprep       Needles:  Injection technique: Single-shot  Needle Type: Echogenic Stimulator Needle     Needle Length: 10cm      Additional Needles:   Procedures:,,,, ultrasound used (permanent image in chart),,,,  Narrative:  Start time: 02/24/2019 10:10 AM End time: 02/24/2019 10:20 AM Injection made incrementally with aspirations every 5 mL.  Performed by: Personally  Anesthesiologist: Montez Hageman, MD  Additional Notes: Risks, benefits and alternative to block explained extensively.  Patient tolerated procedure well, without complications.

## 2019-02-24 NOTE — Progress Notes (Signed)
Assisted Dr. Carignan with left, ultrasound guided, supraclavicular block. Side rails up, monitors on throughout procedure. See vital signs in flow sheet. Tolerated Procedure well. 

## 2019-02-24 NOTE — Op Note (Signed)
I assisted Surgeon(s) and Role:    * Leanora Cover, MD - Primary    Daryll Brod, MD - Assisting on the Procedure(s): OPEN REDUCTION INTERNAL FIXATION (ORIF) LEFT DISTAL RADIAL FRACTURE on 02/24/2019.  I provided assistance on this case as follows: setup, approach, debridement, reduction, stabilization, fixation of the fracture, closure of the incisions and application of the dressings and splint.  Electronically signed by: Daryll Brod, MD Date: 02/24/2019 Time: 1:19 PM

## 2019-02-24 NOTE — Discharge Instructions (Addendum)
Post Anesthesia Home Care Instructions  Activity: Get plenty of rest for the remainder of the day. A responsible individual must stay with you for 24 hours following the procedure.  For the next 24 hours, DO NOT: -Drive a car -Operate machinery -Drink alcoholic beverages -Take any medication unless instructed by your physician -Make any legal decisions or sign important papers.  Meals: Start with liquid foods such as gelatin or soup. Progress to regular foods as tolerated. Avoid greasy, spicy, heavy foods. If nausea and/or vomiting occur, drink only clear liquids until the nausea and/or vomiting subsides. Call your physician if vomiting continues.  Special Instructions/Symptoms: Your throat may feel dry or sore from the anesthesia or the breathing tube placed in your throat during surgery. If this causes discomfort, gargle with warm salt water. The discomfort should disappear within 24 hours.  If you had a scopolamine patch placed behind your ear for the management of post- operative nausea and/or vomiting:  1. The medication in the patch is effective for 72 hours, after which it should be removed.  Wrap patch in a tissue and discard in the trash. Wash hands thoroughly with soap and water. 2. You may remove the patch earlier than 72 hours if you experience unpleasant side effects which may include dry mouth, dizziness or visual disturbances. 3. Avoid touching the patch. Wash your hands with soap and water after contact with the patch.      Regional Anesthesia Blocks  1. Numbness or the inability to move the "blocked" extremity may last from 3-48 hours after placement. The length of time depends on the medication injected and your individual response to the medication. If the numbness is not going away after 48 hours, call your surgeon.  2. The extremity that is blocked will need to be protected until the numbness is gone and the  Strength has returned. Because you cannot feel it, you  will need to take extra care to avoid injury. Because it may be weak, you may have difficulty moving it or using it. You may not know what position it is in without looking at it while the block is in effect.  3. For blocks in the legs and feet, returning to weight bearing and walking needs to be done carefully. You will need to wait until the numbness is entirely gone and the strength has returned. You should be able to move your leg and foot normally before you try and bear weight or walk. You will need someone to be with you when you first try to ensure you do not fall and possibly risk injury.  4. Bruising and tenderness at the needle site are common side effects and will resolve in a few days.  5. Persistent numbness or new problems with movement should be communicated to the surgeon or the [facility] ([phone])/ [facility] ([phone]).    [facility] Center Instructions Hand Surgery  Wound Care: Keep your hand elevated above the level of your heart.  Do not allow it to dangle by your side.  Keep the dressing dry and do not remove it unless your doctor advises you to do so.  He will usually change it at the time of your post-op visit.  Moving your fingers is advised to stimulate circulation but will depend on the site of your surgery.  If you have a splint applied, your doctor will advise you regarding movement.  Activity: Do not drive or operate machinery today.  Rest today and   then you may return to your normal activity and work as indicated by your physician.  Diet:  Drink liquids today or eat a light diet.  You may resume a regular diet tomorrow.    General expectations: Pain for two to three days. Fingers may become slightly swollen.  Call your doctor if any of the following occur: Severe pain not relieved by pain medication. Elevated temperature. Dressing soaked with blood. Inability to move fingers. White or bluish color to fingers.  

## 2019-02-24 NOTE — Anesthesia Postprocedure Evaluation (Signed)
Anesthesia Post Note  Patient: Sara Frost  Procedure(s) Performed: OPEN REDUCTION INTERNAL FIXATION (ORIF) LEFT DISTAL RADIAL FRACTURE (Left Wrist)     Patient location during evaluation: PACU Anesthesia Type: General Level of consciousness: awake and alert Pain management: pain level controlled Vital Signs Assessment: post-procedure vital signs reviewed and stable Respiratory status: spontaneous breathing, nonlabored ventilation and respiratory function stable Cardiovascular status: blood pressure returned to baseline and stable Postop Assessment: no apparent nausea or vomiting Anesthetic complications: no    Last Vitals:  Vitals:   02/24/19 1345 02/24/19 1400  BP: 136/77 131/74  Pulse: (!) 48 (!) 50  Resp: 14 14  Temp:    SpO2: 100% 100%    Last Pain:  Vitals:   02/24/19 1400  TempSrc:   PainSc: 0-No pain                 Brennan Bailey

## 2019-02-24 NOTE — Transfer of Care (Signed)
Immediate Anesthesia Transfer of Care Note  Patient: Sara Frost  Procedure(s) Performed: OPEN REDUCTION INTERNAL FIXATION (ORIF) LEFT DISTAL RADIAL FRACTURE (Left Wrist)  Patient Location: PACU  Anesthesia Type:GA combined with regional for post-op pain  Level of Consciousness: drowsy and patient cooperative  Airway & Oxygen Therapy: Patient Spontanous Breathing and Patient connected to nasal cannula oxygen  Post-op Assessment: Report given to RN and Post -op Vital signs reviewed and stable  Post vital signs: Reviewed and stable  Last Vitals:  Vitals Value Taken Time  BP    Temp    Pulse 69 02/24/19 1320  Resp    SpO2 100 % 02/24/19 1320  Vitals shown include unvalidated device data.  Last Pain:  Vitals:   02/24/19 0913  TempSrc:   PainSc: 7          Complications: No apparent anesthesia complications

## 2019-02-24 NOTE — Anesthesia Preprocedure Evaluation (Signed)
Anesthesia Evaluation  Patient identified by MRN, date of birth, ID band Patient awake    Reviewed: Allergy & Precautions, NPO status , Patient's Chart, lab work & pertinent test results  Airway Mallampati: II  TM Distance: >3 FB Neck ROM: Full    Dental no notable dental hx.    Pulmonary neg pulmonary ROS,    Pulmonary exam normal breath sounds clear to auscultation       Cardiovascular negative cardio ROS Normal cardiovascular exam Rhythm:Regular Rate:Normal     Neuro/Psych negative neurological ROS  negative psych ROS   GI/Hepatic negative GI ROS, Neg liver ROS,   Endo/Other  negative endocrine ROS  Renal/GU negative Renal ROS  negative genitourinary   Musculoskeletal negative musculoskeletal ROS (+)   Abdominal   Peds negative pediatric ROS (+)  Hematology negative hematology ROS (+)   Anesthesia Other Findings   Reproductive/Obstetrics negative OB ROS                             Anesthesia Physical Anesthesia Plan  ASA: I  Anesthesia Plan: General   Post-op Pain Management:  Regional for Post-op pain   Induction: Intravenous  PONV Risk Score and Plan: 3 and Ondansetron and Treatment may vary due to age or medical condition  Airway Management Planned: LMA  Additional Equipment:   Intra-op Plan:   Post-operative Plan:   Informed Consent: I have reviewed the patients History and Physical, chart, labs and discussed the procedure including the risks, benefits and alternatives for the proposed anesthesia with the patient or authorized representative who has indicated his/her understanding and acceptance.     Dental advisory given  Plan Discussed with: CRNA  Anesthesia Plan Comments: (Long discussion with patient about transient nerve injury after previous block for clavicle fracture. Risks,benefits and alternative explained extensively. She realizes nerve injury is  possible from block today and that there is no way to differentiate from surigical injury vs block injury. Despite risks she really wants the block done. Full extensive complete informed consent has been obtained.)        Anesthesia Quick Evaluation

## 2019-02-24 NOTE — Anesthesia Procedure Notes (Signed)
Procedure Name: LMA Insertion Date/Time: 02/24/2019 12:04 PM Performed by: Lyndee Leo, CRNA Pre-anesthesia Checklist: Patient identified, Emergency Drugs available, Suction available and Patient being monitored Patient Re-evaluated:Patient Re-evaluated prior to induction Oxygen Delivery Method: Circle system utilized Preoxygenation: Pre-oxygenation with 100% oxygen Induction Type: IV induction Ventilation: Mask ventilation without difficulty LMA: LMA inserted LMA Size: 4.0 Number of attempts: 1 Airway Equipment and Method: Bite block Placement Confirmation: positive ETCO2 Tube secured with: Tape Dental Injury: Teeth and Oropharynx as per pre-operative assessment

## 2019-02-25 ENCOUNTER — Encounter (HOSPITAL_BASED_OUTPATIENT_CLINIC_OR_DEPARTMENT_OTHER): Payer: Self-pay | Admitting: Orthopedic Surgery

## 2019-02-25 NOTE — Op Note (Signed)
Intra-operative fluoroscopic images in the AP, lateral, and oblique views were taken and evaluated by myself.  Reduction and hardware placement were confirmed.  There was no intraarticular penetration of permanent hardware.  

## 2019-06-21 ENCOUNTER — Emergency Department (HOSPITAL_COMMUNITY)
Admission: EM | Admit: 2019-06-21 | Discharge: 2019-06-21 | Disposition: A | Discharge status: Home / Self Care | Payer: Self-pay | Attending: Emergency Medicine | Admitting: Emergency Medicine

## 2019-06-21 ENCOUNTER — Emergency Department (HOSPITAL_COMMUNITY): Payer: Self-pay

## 2019-06-21 ENCOUNTER — Encounter (HOSPITAL_COMMUNITY): Payer: Self-pay

## 2019-06-21 ENCOUNTER — Other Ambulatory Visit: Payer: Self-pay

## 2019-06-21 DIAGNOSIS — Y999 Unspecified external cause status: Secondary | ICD-10-CM | POA: Insufficient documentation

## 2019-06-21 DIAGNOSIS — Y9289 Other specified places as the place of occurrence of the external cause: Secondary | ICD-10-CM | POA: Insufficient documentation

## 2019-06-21 DIAGNOSIS — Y9341 Activity, dancing: Secondary | ICD-10-CM | POA: Insufficient documentation

## 2019-06-21 DIAGNOSIS — X509XXA Other and unspecified overexertion or strenuous movements or postures, initial encounter: Secondary | ICD-10-CM | POA: Insufficient documentation

## 2019-06-21 DIAGNOSIS — S99192A Other physeal fracture of left metatarsal, initial encounter for closed fracture: Secondary | ICD-10-CM | POA: Insufficient documentation

## 2019-06-21 MED ORDER — OXYCODONE-ACETAMINOPHEN 5-325 MG PO TABS: 1.0000 | ORAL_TABLET | Freq: Four times a day (QID) | ORAL | 0 refills | Status: DC | PRN

## 2019-06-21 NOTE — Discharge Instructions (Addendum)
Do not bare any weight onto your foot until you follow up with [facility] Wear post op shoe and use crutches as needed to get around When at home please elevate foot with 2-3 pillows and ice to help reduce inflammation I have prescribed short course of narcotic pain medication to take as needed

## 2019-06-21 NOTE — ED Triage Notes (Signed)
Pt presents w/Left foot pain since falling last night. Presents today w/bruisingh and swelling.

## 2019-06-21 NOTE — ED Provider Notes (Signed)
[facility] Provider Note   CSN: [id number] Arrival date & time: 06/21/19  1943     History   Chief Complaint Chief Complaint  Patient presents with   Foot Pain    HPI Sara Frost is a 28 y.o. female who presents to the ED today complaining of gradual onset, constant, achy, left foot pain s/p mechanical fall that occurred last night.  She reports that she was dancing on states last night when she went down the stairs and twisted her left ankle causing her to fall.  No head injury or loss of consciousness.  Patient states she immediately got back up and finished her dance routine.  She states she went home and felt fine but when she woke up this morning she noticed bruising to the lateral aspect of the foot and swelling.  She states that the pain is manageable until she attempts to bear weight causing severe pain.  She states that she has been icing it and elevating it but has not taken anything for her pain.  No previous injury to her foot or ankle. No other complaints at this time.        Past Medical History:  Diagnosis Date   Complication of anesthesia    Pt states that she had nerve damage following the block she was given for clavicle repair    Fatty liver    Migraines     There are no active problems to display for this patient.   Past Surgical History:  Procedure Laterality Date   CLAVICLE SURGERY     OPEN REDUCTION INTERNAL FIXATION (ORIF) DISTAL RADIAL FRACTURE Left 02/24/2019   Procedure: OPEN REDUCTION INTERNAL FIXATION (ORIF) LEFT DISTAL RADIAL FRACTURE;  Surgeon: Betha LoaKuzma, Kevin, MD;  Location: [facility];  Service: Orthopedics;  Laterality: Left;     OB History   No obstetric history on file.      Home Medications    Prior to Admission medications   Medication Sig Start Date End Date Taking? Authorizing Provider  loratadine (CLARITIN) 10 MG tablet Take 10 mg by mouth daily as needed for  allergies.    [provider]  ondansetron (ZOFRAN) 4 MG tablet Take 1 tablet (4 mg total) by mouth every 8 (eight) hours as needed for nausea or vomiting. Please prescribe oral disintegrating tablet 07/23/18   Sherrilyn Ristanis, Henry L III, MD  oxyCODONE-acetaminophen (PERCOCET/ROXICET) 5-325 MG tablet Take 1 tablet by mouth every 6 (six) hours as needed for severe pain. 06/21/19   Tanda RockersVenter, Lakena Sparlin, PA-C    Family History Family History  Problem Relation Age of Onset   Lung cancer Mother    Liver cancer Maternal Grandmother    Pancreatic cancer Paternal Grandfather     Social History Social History   Tobacco Use   Smoking status: Never Smoker   Smokeless tobacco: Never Used  Substance Use Topics   Alcohol use: Never    Frequency: Never   Drug use: Yes    Types: Marijuana    Comment: last used last night 08/04/18     Allergies   Penicillins and Vicodin [hydrocodone-acetaminophen]   Review of Systems Review of Systems  Constitutional: Negative for chills and fever.  Musculoskeletal: Positive for arthralgias and joint swelling.  Skin: Positive for color change (bruising).  Neurological: Negative for syncope and headaches.     Physical Exam Updated Vital Signs BP 136/78    Pulse 67    Temp 98.4 F (36.9 C) (Oral)  Resp 16    SpO2 100%   Physical Exam Vitals signs and nursing note reviewed.  Constitutional:      Appearance: She is not ill-appearing or diaphoretic.  HENT:     Head: Normocephalic and atraumatic.  Eyes:     Conjunctiva/sclera: Conjunctivae normal.  Cardiovascular:     Rate and Rhythm: Normal rate and regular rhythm.     Pulses: Normal pulses.  Pulmonary:     Effort: Pulmonary effort is normal.     Breath sounds: Normal breath sounds. No wheezing, rhonchi or rales.  Musculoskeletal:     Comments: Ecchymosis noted to lateral aspect of left foot near proximal aspect of 5th metatarsal with moderate swelling. TTP directly over 5th metatarsal as  well as dorsum of foot. Mild tenderness to lateral malleolus. No tenderness to medial malleolus. ROM limited due to pain although pt still able to dorsiflex and plantarflex without issue. NVI. 2+ DP and PT pulse.   Skin:    General: Skin is warm and dry.     Coloration: Skin is not jaundiced.  Neurological:     Mental Status: She is alert.      ED Treatments / Results  Labs (all labs ordered are listed, but only abnormal results are displayed) Labs Reviewed - No data to display  EKG None  Radiology Dg Ankle Complete Left  Result Date: 06/21/2019 CLINICAL DATA:  [place] while wearing heels EXAM: LEFT ANKLE COMPLETE - 3+ VIEW COMPARISON:  Same day foot radiograph FINDINGS: Avulsive appearing fracture at the base of the fifth metatarsal. Soft tissue swelling is noted laterally about the ankle and more pronounced towards the lateral hindfoot. Bones of the ankle are intact. Ankle mortise remains congruent. No ankle effusion. IMPRESSION: Likely avulsion fracture of the base of the fifth metatarsal. Better detailed on dedicated foot radiographs. No ankle fracture or traumatic malalignment.  No ankle effusion. Electronically Signed   By: Lovena Le M.D.   On: 06/21/2019 21:17   Dg Foot Complete Left  Result Date: 06/21/2019 CLINICAL DATA:  Foot pain EXAM: LEFT FOOT - COMPLETE 3+ VIEW COMPARISON:  None. FINDINGS: Minimally displaced, likely avulsive type fracture involving the base of the fifth metatarsal with lateral soft tissue swelling along the mid and hindfoot. No additional fracture or traumatic malalignment is seen. Bone mineralization is appropriate. Small corticated os trigonum is noted posteriorly. IMPRESSION: Minimally displaced, likely avulsive type fracture involving the base of the fifth metatarsal with lateral soft tissue swelling. Electronically Signed   By: Lovena Le M.D.   On: 06/21/2019 21:18    Procedures Procedures (including critical care time)  Medications Ordered in  ED Medications - No data to display   Initial Impression / Assessment and Plan / ED Course  I have reviewed the triage vital signs and the nursing notes.  Pertinent labs & imaging results that were available during my care of the patient were reviewed by me and considered in my medical decision making (see chart for details).    28 year old female presents to the ED today with left lateral foot pain after inverting ankle last night while dancing in heels.  And has obvious tenderness to the proximal aspect of the fifth metatarsal with bruising and swelling.  Suspect fifth metatarsal fracture today.  Will obtain x-rays of ankle and foot and reevaluate.  No other injuries at this time.  No head injury or loss of consciousness. Vital signs stable. Compartments are soft.   Xray positive for avulsion fracture to proximal  5th metatarsal. Will provide crutches and post op shoe.  Already seen at [facility] previously for radius fx. Advised that she follow-up with them and call them in the morning to schedule an appointment.  Advised to refrain from bearing weight and to use crutches as needed.  I have prescribed a course of narcotic pain medication for patient to take as needed.  Rice therapy discussed as well.  Patient is in agreement with plan at this time and stable for discharge home.  This note was prepared using Dragon voice recognition software and may include unintentional dictation errors due to the inherent limitations of voice recognition software.       Final Clinical Impressions(s) / ED Diagnoses   Final diagnoses:  Closed fracture of base of fifth metatarsal bone of left foot at metaphyseal-diaphyseal junction, initial encounter    ED Discharge Orders         Ordered    oxyCODONE-acetaminophen (PERCOCET/ROXICET) 5-325 MG tablet  Every 6 hours PRN     06/21/19 2144           Tanda Rockers, PA-C 06/21/19 2201    Melene Plan, DO 06/21/19 2202

## 2019-08-30 ENCOUNTER — Other Ambulatory Visit: Payer: Self-pay | Admitting: Orthopedic Surgery

## 2019-08-30 DIAGNOSIS — M898X9 Other specified disorders of bone, unspecified site: Secondary | ICD-10-CM | POA: Insufficient documentation

## 2019-08-31 ENCOUNTER — Encounter (HOSPITAL_BASED_OUTPATIENT_CLINIC_OR_DEPARTMENT_OTHER): Payer: Self-pay | Admitting: Orthopedic Surgery

## 2019-08-31 ENCOUNTER — Other Ambulatory Visit: Payer: Self-pay

## 2019-09-05 ENCOUNTER — Other Ambulatory Visit (HOSPITAL_COMMUNITY)
Admission: RE | Admit: 2019-09-05 | Discharge: 2019-09-05 | Disposition: A | Discharge status: Home / Self Care | Payer: HRSA Program | Source: Ambulatory Visit | Attending: Orthopedic Surgery | Admitting: Orthopedic Surgery

## 2019-09-05 DIAGNOSIS — Z20822 Contact with and (suspected) exposure to covid-19: Secondary | ICD-10-CM | POA: Insufficient documentation

## 2019-09-05 DIAGNOSIS — Z01812 Encounter for preprocedural laboratory examination: Secondary | ICD-10-CM | POA: Diagnosis present

## 2019-09-06 LAB — NOVEL CORONAVIRUS, NAA (HOSP ORDER, SEND-OUT TO REF LAB; TAT 18-24 HRS): SARS-CoV-2, NAA: NOT DETECTED

## 2019-09-08 ENCOUNTER — Encounter (HOSPITAL_BASED_OUTPATIENT_CLINIC_OR_DEPARTMENT_OTHER): Payer: Self-pay | Admitting: Orthopedic Surgery

## 2019-09-08 ENCOUNTER — Other Ambulatory Visit: Payer: Self-pay

## 2019-09-08 ENCOUNTER — Encounter (HOSPITAL_BASED_OUTPATIENT_CLINIC_OR_DEPARTMENT_OTHER)
Admission: RE | Disposition: A | Discharge status: Home / Self Care | Payer: Self-pay | Source: Home / Self Care | Attending: Orthopedic Surgery

## 2019-09-08 ENCOUNTER — Ambulatory Visit (HOSPITAL_BASED_OUTPATIENT_CLINIC_OR_DEPARTMENT_OTHER)
Admission: RE | Admit: 2019-09-08 | Discharge: 2019-09-08 | Disposition: A | Discharge status: Home / Self Care | Payer: Self-pay | Attending: Orthopedic Surgery | Admitting: Orthopedic Surgery

## 2019-09-08 ENCOUNTER — Ambulatory Visit (HOSPITAL_BASED_OUTPATIENT_CLINIC_OR_DEPARTMENT_OTHER): Payer: Self-pay | Admitting: Anesthesiology

## 2019-09-08 DIAGNOSIS — Z79899 Other long term (current) drug therapy: Secondary | ICD-10-CM | POA: Insufficient documentation

## 2019-09-08 DIAGNOSIS — Z880 Allergy status to penicillin: Secondary | ICD-10-CM | POA: Insufficient documentation

## 2019-09-08 DIAGNOSIS — Z801 Family history of malignant neoplasm of trachea, bronchus and lung: Secondary | ICD-10-CM | POA: Insufficient documentation

## 2019-09-08 DIAGNOSIS — M899 Disorder of bone, unspecified: Secondary | ICD-10-CM | POA: Insufficient documentation

## 2019-09-08 DIAGNOSIS — K760 Fatty (change of) liver, not elsewhere classified: Secondary | ICD-10-CM | POA: Insufficient documentation

## 2019-09-08 DIAGNOSIS — Z800 Family history of malignant neoplasm of digestive organs: Secondary | ICD-10-CM | POA: Insufficient documentation

## 2019-09-08 DIAGNOSIS — Z885 Allergy status to narcotic agent status: Secondary | ICD-10-CM | POA: Insufficient documentation

## 2019-09-08 DIAGNOSIS — G43909 Migraine, unspecified, not intractable, without status migrainosus: Secondary | ICD-10-CM | POA: Insufficient documentation

## 2019-09-08 DIAGNOSIS — M67834 Other specified disorders of tendon, left wrist: Secondary | ICD-10-CM | POA: Insufficient documentation

## 2019-09-08 HISTORY — PX: BONE EXOSTOSIS EXCISION: SHX1249

## 2019-09-08 LAB — POCT PREGNANCY, URINE: Preg Test, Ur: NEGATIVE

## 2019-09-08 SURGERY — EXCISION, EXOSTOSIS
Anesthesia: General | Site: Wrist | Laterality: Left

## 2019-09-08 MED ORDER — CLINDAMYCIN PHOSPHATE 900 MG/50ML IV SOLN
900.0000 mg | INTRAVENOUS | Status: AC
  Administered 2019-09-08: 900 mg via INTRAVENOUS

## 2019-09-08 MED ORDER — ONDANSETRON HCL 4 MG/2ML IJ SOLN
INTRAMUSCULAR | Status: AC
  Filled 2019-09-08: qty 2

## 2019-09-08 MED ORDER — FENTANYL CITRATE (PF) 100 MCG/2ML IJ SOLN
50.0000 ug | INTRAMUSCULAR | Status: AC | PRN
  Administered 2019-09-08: 50 ug via INTRAVENOUS
  Administered 2019-09-08 (×2): 25 ug via INTRAVENOUS
  Administered 2019-09-08: 50 ug via INTRAVENOUS

## 2019-09-08 MED ORDER — KETOROLAC TROMETHAMINE 30 MG/ML IJ SOLN
30.0000 mg | Freq: Once | INTRAMUSCULAR | Status: AC | PRN
  Administered 2019-09-08: 12:00:00 30 mg via INTRAVENOUS

## 2019-09-08 MED ORDER — CHLORHEXIDINE GLUCONATE 4 % EX LIQD: 60.0000 mL | Freq: Once | CUTANEOUS | Status: DC

## 2019-09-08 MED ORDER — OXYCODONE-ACETAMINOPHEN 5-325 MG PO TABS: ORAL_TABLET | ORAL | 0 refills | Status: DC

## 2019-09-08 MED ORDER — MIDAZOLAM HCL 2 MG/2ML IJ SOLN
INTRAMUSCULAR | Status: AC
  Filled 2019-09-08: qty 2

## 2019-09-08 MED ORDER — LACTATED RINGERS IV SOLN: INTRAVENOUS | Status: DC

## 2019-09-08 MED ORDER — ACETAMINOPHEN 500 MG PO TABS
1000.0000 mg | ORAL_TABLET | Freq: Once | ORAL | Status: AC
  Administered 2019-09-08: 1000 mg via ORAL

## 2019-09-08 MED ORDER — KETOROLAC TROMETHAMINE 15 MG/ML IJ SOLN
INTRAMUSCULAR | Status: AC
  Filled 2019-09-08: qty 1

## 2019-09-08 MED ORDER — FENTANYL CITRATE (PF) 100 MCG/2ML IJ SOLN: 25.0000 ug | INTRAMUSCULAR | Status: DC | PRN

## 2019-09-08 MED ORDER — LIDOCAINE 2% (20 MG/ML) 5 ML SYRINGE
INTRAMUSCULAR | Status: DC | PRN
  Administered 2019-09-08: 100 mg via INTRAVENOUS

## 2019-09-08 MED ORDER — FENTANYL CITRATE (PF) 100 MCG/2ML IJ SOLN
INTRAMUSCULAR | Status: AC
  Filled 2019-09-08: qty 2

## 2019-09-08 MED ORDER — IBUPROFEN 100 MG/5ML PO SUSP: 200.0000 mg | Freq: Four times a day (QID) | ORAL | Status: DC | PRN

## 2019-09-08 MED ORDER — GABAPENTIN 300 MG PO CAPS
ORAL_CAPSULE | ORAL | Status: AC
  Filled 2019-09-08: qty 1

## 2019-09-08 MED ORDER — ACETAMINOPHEN 500 MG PO TABS
ORAL_TABLET | ORAL | Status: AC
  Filled 2019-09-08: qty 2

## 2019-09-08 MED ORDER — CLINDAMYCIN PHOSPHATE 900 MG/50ML IV SOLN
INTRAVENOUS | Status: AC
  Filled 2019-09-08: qty 50

## 2019-09-08 MED ORDER — PROPOFOL 10 MG/ML IV BOLUS
INTRAVENOUS | Status: DC | PRN
  Administered 2019-09-08: 200 mg via INTRAVENOUS

## 2019-09-08 MED ORDER — ONDANSETRON HCL 4 MG/2ML IJ SOLN: 4.0000 mg | Freq: Once | INTRAMUSCULAR | Status: DC | PRN

## 2019-09-08 MED ORDER — PHENYLEPHRINE 40 MCG/ML (10ML) SYRINGE FOR IV PUSH (FOR BLOOD PRESSURE SUPPORT)
PREFILLED_SYRINGE | INTRAVENOUS | Status: AC
  Filled 2019-09-08: qty 10

## 2019-09-08 MED ORDER — MIDAZOLAM HCL 2 MG/2ML IJ SOLN
1.0000 mg | INTRAMUSCULAR | Status: DC | PRN
  Administered 2019-09-08: 2 mg via INTRAVENOUS

## 2019-09-08 MED ORDER — BUPIVACAINE HCL (PF) 0.25 % IJ SOLN
INTRAMUSCULAR | Status: DC | PRN
  Administered 2019-09-08: 4 mL

## 2019-09-08 MED ORDER — ONDANSETRON HCL 4 MG/2ML IJ SOLN
INTRAMUSCULAR | Status: DC | PRN
  Administered 2019-09-08: 4 mg via INTRAVENOUS

## 2019-09-08 MED ORDER — DEXAMETHASONE SODIUM PHOSPHATE 10 MG/ML IJ SOLN
INTRAMUSCULAR | Status: AC
  Filled 2019-09-08: qty 1

## 2019-09-08 MED ORDER — KETOROLAC TROMETHAMINE 15 MG/ML IJ SOLN
15.0000 mg | Freq: Once | INTRAMUSCULAR | Status: AC
  Administered 2019-09-08: 10:00:00 15 mg via INTRAVENOUS

## 2019-09-08 MED ORDER — GABAPENTIN 300 MG PO CAPS
300.0000 mg | ORAL_CAPSULE | Freq: Once | ORAL | Status: AC
  Administered 2019-09-08: 10:00:00 300 mg via ORAL

## 2019-09-08 MED ORDER — MEPERIDINE HCL 25 MG/ML IJ SOLN: 6.2500 mg | INTRAMUSCULAR | Status: DC | PRN

## 2019-09-08 MED ORDER — IBUPROFEN 200 MG PO TABS: 200.0000 mg | ORAL_TABLET | Freq: Four times a day (QID) | ORAL | Status: DC | PRN

## 2019-09-08 MED ORDER — DEXAMETHASONE SODIUM PHOSPHATE 10 MG/ML IJ SOLN
INTRAMUSCULAR | Status: DC | PRN
  Administered 2019-09-08: 10 mg via INTRAVENOUS

## 2019-09-08 MED ORDER — LIDOCAINE 2% (20 MG/ML) 5 ML SYRINGE
INTRAMUSCULAR | Status: AC
  Filled 2019-09-08: qty 5

## 2019-09-08 MED ORDER — PROPOFOL 10 MG/ML IV BOLUS
INTRAVENOUS | Status: AC
  Filled 2019-09-08: qty 20

## 2019-09-08 MED ORDER — KETOROLAC TROMETHAMINE 30 MG/ML IJ SOLN
INTRAMUSCULAR | Status: AC
  Filled 2019-09-08: qty 1

## 2019-09-08 SURGICAL SUPPLY — 85 items
BAG DECANTER FOR FLEXI CONT (MISCELLANEOUS) IMPLANT
BENZOIN TINCTURE PRP APPL 2/3 (GAUZE/BANDAGES/DRESSINGS) ×3 IMPLANT
BLADE MINI RND TIP GREEN BEAV (BLADE) ×3 IMPLANT
BLADE SURG 15 STRL LF DISP TIS (BLADE) ×2 IMPLANT
BLADE SURG 15 STRL SS (BLADE) ×4
BNDG CONFORM 2 STRL LF (GAUZE/BANDAGES/DRESSINGS) IMPLANT
BNDG ELASTIC 2X5.8 VLCR STR LF (GAUZE/BANDAGES/DRESSINGS) IMPLANT
BNDG ELASTIC 3X5.8 VLCR STR LF (GAUZE/BANDAGES/DRESSINGS) ×3 IMPLANT
BNDG ESMARK 4X9 LF (GAUZE/BANDAGES/DRESSINGS) ×3 IMPLANT
BNDG GAUZE ELAST 4 BULKY (GAUZE/BANDAGES/DRESSINGS) ×3 IMPLANT
CHLORAPREP W/TINT 26 (MISCELLANEOUS) ×3 IMPLANT
CLOSURE WOUND 1/2 X4 (GAUZE/BANDAGES/DRESSINGS) ×1
CORD BIPOLAR FORCEPS 12FT (ELECTRODE) ×3 IMPLANT
COTTONBALL LRG STERILE PKG (GAUZE/BANDAGES/DRESSINGS) IMPLANT
COVER BACK TABLE 60X90IN (DRAPES) ×3 IMPLANT
COVER MAYO STAND STRL (DRAPES) ×3 IMPLANT
COVER WAND RF STERILE (DRAPES) IMPLANT
CUFF TOURN SGL QUICK 18X4 (TOURNIQUET CUFF) ×3 IMPLANT
DECANTER SPIKE VIAL GLASS SM (MISCELLANEOUS) IMPLANT
DRAIN TLS ROUND 10FR (DRAIN) IMPLANT
DRAPE EXTREMITY T 121X128X90 (DISPOSABLE) ×3 IMPLANT
DRAPE OEC MINIVIEW 54X84 (DRAPES) IMPLANT
DRAPE SURG 17X23 STRL (DRAPES) ×3 IMPLANT
DRSG PAD ABDOMINAL 8X10 ST (GAUZE/BANDAGES/DRESSINGS) ×3 IMPLANT
GAUZE 4X4 16PLY RFD (DISPOSABLE) IMPLANT
GAUZE SPONGE 4X4 12PLY STRL (GAUZE/BANDAGES/DRESSINGS) ×3 IMPLANT
GAUZE XEROFORM 1X8 LF (GAUZE/BANDAGES/DRESSINGS) ×3 IMPLANT
GLOVE BIO SURGEON STRL SZ 6.5 (GLOVE) ×4 IMPLANT
GLOVE BIO SURGEON STRL SZ7.5 (GLOVE) ×3 IMPLANT
GLOVE BIO SURGEONS STRL SZ 6.5 (GLOVE) ×2
GLOVE BIOGEL PI IND STRL 6.5 (GLOVE) ×1 IMPLANT
GLOVE BIOGEL PI IND STRL 8 (GLOVE) ×1 IMPLANT
GLOVE BIOGEL PI INDICATOR 6.5 (GLOVE) ×2
GLOVE BIOGEL PI INDICATOR 8 (GLOVE) ×2
GLOVE ECLIPSE 6.5 STRL STRAW (GLOVE) ×3 IMPLANT
GOWN STRL REUS W/ TWL LRG LVL3 (GOWN DISPOSABLE) ×2 IMPLANT
GOWN STRL REUS W/TWL LRG LVL3 (GOWN DISPOSABLE) ×4
GOWN STRL REUS W/TWL XL LVL3 (GOWN DISPOSABLE) ×6 IMPLANT
K-WIRE .035X4 (WIRE) IMPLANT
LOOP VESSEL MAXI BLUE (MISCELLANEOUS) IMPLANT
NEEDLE HYPO 22GX1.5 SAFETY (NEEDLE) IMPLANT
NEEDLE HYPO 25X1 1.5 SAFETY (NEEDLE) ×3 IMPLANT
NEEDLE KEITH (NEEDLE) IMPLANT
NS IRRIG 1000ML POUR BTL (IV SOLUTION) ×3 IMPLANT
PACK BASIN DAY SURGERY FS (CUSTOM PROCEDURE TRAY) ×3 IMPLANT
PAD CAST 3X4 CTTN HI CHSV (CAST SUPPLIES) ×1 IMPLANT
PAD CAST 4YDX4 CTTN HI CHSV (CAST SUPPLIES) IMPLANT
PADDING CAST ABS 3INX4YD NS (CAST SUPPLIES)
PADDING CAST ABS 4INX4YD NS (CAST SUPPLIES) ×2
PADDING CAST ABS COTTON 3X4 (CAST SUPPLIES) IMPLANT
PADDING CAST ABS COTTON 4X4 ST (CAST SUPPLIES) ×1 IMPLANT
PADDING CAST COTTON 3X4 STRL (CAST SUPPLIES) ×2
PADDING CAST COTTON 4X4 STRL (CAST SUPPLIES)
SLEEVE SCD COMPRESS KNEE MED (MISCELLANEOUS) ×3 IMPLANT
SPLINT PLASTER CAST XFAST 3X15 (CAST SUPPLIES) IMPLANT
SPLINT PLASTER CAST XFAST 4X15 (CAST SUPPLIES) IMPLANT
SPLINT PLASTER XTRA FAST SET 4 (CAST SUPPLIES)
SPLINT PLASTER XTRA FASTSET 3X (CAST SUPPLIES)
STOCKINETTE 4X48 STRL (DRAPES) ×3 IMPLANT
STRIP CLOSURE SKIN 1/2X4 (GAUZE/BANDAGES/DRESSINGS) ×2 IMPLANT
SUT CHROMIC 5 0 P 3 (SUTURE) IMPLANT
SUT ETHIBOND 3-0 V-5 (SUTURE) IMPLANT
SUT ETHILON 3 0 PS 1 (SUTURE) IMPLANT
SUT ETHILON 4 0 PS 2 18 (SUTURE) IMPLANT
SUT FIBERWIRE 3-0 18 TAPR NDL (SUTURE)
SUT FIBERWIRE 4-0 18 DIAM BLUE (SUTURE)
SUT MERSILENE 2.0 SH NDLE (SUTURE) IMPLANT
SUT MERSILENE 4 0 P 3 (SUTURE) IMPLANT
SUT MNCRL AB 4-0 PS2 18 (SUTURE) ×3 IMPLANT
SUT PROLENE 2 0 SH DA (SUTURE) IMPLANT
SUT PROLENE 6 0 P 1 18 (SUTURE) IMPLANT
SUT SILK 2 0 PERMA HAND 18 BK (SUTURE) IMPLANT
SUT SILK 4 0 PS 2 (SUTURE) IMPLANT
SUT VIC AB 3-0 PS1 18 (SUTURE)
SUT VIC AB 3-0 PS1 18XBRD (SUTURE) IMPLANT
SUT VIC AB 4-0 P-3 18XBRD (SUTURE) ×1 IMPLANT
SUT VIC AB 4-0 P3 18 (SUTURE) ×2
SUT VICRYL 4-0 PS2 18IN ABS (SUTURE) ×3 IMPLANT
SUTURE FIBERWR 3-0 18 TAPR NDL (SUTURE) IMPLANT
SUTURE FIBERWR 4-0 18 DIA BLUE (SUTURE) IMPLANT
SYR BULB 3OZ (MISCELLANEOUS) ×3 IMPLANT
SYR CONTROL 10ML LL (SYRINGE) ×3 IMPLANT
TOWEL GREEN STERILE FF (TOWEL DISPOSABLE) ×6 IMPLANT
TUBE FEEDING ENTERAL 5FR 16IN (TUBING) IMPLANT
UNDERPAD 30X36 HEAVY ABSORB (UNDERPADS AND DIAPERS) ×3 IMPLANT

## 2019-09-08 NOTE — H&P (Signed)
Sara Frost is an 29 y.o. female.   Chief Complaint: left wrist exostosis HPI: 29 yo female 6 months s/p orif left distal radius with bony exostosis dorsum of wrist.  This is bothersome to her and she wishes to have it removed.  Allergies:  Allergies  Allergen Reactions  . Penicillins Nausea Only    Has patient had a PCN reaction causing immediate rash, facial/tongue/throat swelling, SOB or lightheadedness with hypotension: Y Has patient had a PCN reaction causing severe rash involving mucus membranes or skin necrosis: Y Has patient had a PCN reaction that required hospitalization: N Has patient had a PCN reaction occurring within the last 10 years: Y If all of the above answers are "NO", then may proceed with Cephalosporin use.   . Vicodin [Hydrocodone-Acetaminophen] Nausea And Vomiting    Past Medical History:  Diagnosis Date  . Fatty liver   . Migraines     Past Surgical History:  Procedure Laterality Date  . CLAVICLE SURGERY    . OPEN REDUCTION INTERNAL FIXATION (ORIF) DISTAL RADIAL FRACTURE Left 02/24/2019   Procedure: OPEN REDUCTION INTERNAL FIXATION (ORIF) LEFT DISTAL RADIAL FRACTURE;  Surgeon: Betha Loa, MD;  Location: [facility];  Service: Orthopedics;  Laterality: Left;    Family History: Family History  Problem Relation Age of Onset  . Lung cancer Mother   . Liver cancer Maternal Grandmother   . Pancreatic cancer Paternal Grandfather     Social History:   reports that she has never smoked. She has never used smokeless tobacco. She reports current drug use. Drug: Marijuana. She reports that she does not drink alcohol.  Medications: Medications Prior to Admission  Medication Sig Dispense Refill  . loratadine (CLARITIN) 10 MG tablet Take 10 mg by mouth daily as needed for allergies.    Marland Kitchen ondansetron (ZOFRAN) 4 MG tablet Take 1 tablet (4 mg total) by mouth every 8 (eight) hours as needed for nausea or vomiting. Please prescribe oral  disintegrating tablet 45 tablet 2    Results for orders placed or performed during the hospital encounter of 09/08/19 (from the past 48 hour(s))  Pregnancy, urine POC     Status: None   Collection Time: 09/08/19  9:15 AM  Result Value Ref Range   Preg Test, Ur NEGATIVE NEGATIVE    Comment:        THE SENSITIVITY OF THIS METHODOLOGY IS >24 mIU/mL     No results found.   A comprehensive review of systems was negative.  Blood pressure 127/70, pulse 61, temperature 98.4 F (36.9 C), temperature source Oral, resp. rate 18, height 5\' 8"  (1.727 m), weight 73.4 kg, last menstrual period 08/23/2019, SpO2 100 %.  General appearance: alert, cooperative and appears stated age Head: Normocephalic, without obvious abnormality, atraumatic Neck: supple, symmetrical, trachea midline Cardio: regular rate and rhythm Resp: clear to auscultation bilaterally Extremities: Intact sensation and capillary refill all digits.  +epl/fpl/io.  No wounds.  Pulses: 2+ and symmetric Skin: Skin color, texture, turgor normal. No rashes or lesions Neurologic: Grossly normal Incision/Wound: none  Assessment/Plan Left wrist bony exostosis.  Non operative and operative treatment options have been discussed with the patient and patient wishes to proceed with operative treatment. Risks, benefits, and alternatives of surgery have been discussed and the patient agrees with the plan of care.   10/21/2019 09/08/2019, 10:16 AM

## 2019-09-08 NOTE — Transfer of Care (Signed)
Immediate Anesthesia Transfer of Care Note  Patient: Evangela Bangerter  Procedure(s) Performed: LEFT WRIST EXOSTOSIS EXCISION (Left Wrist)  Patient Location: PACU  Anesthesia Type:General  Level of Consciousness: drowsy  Airway & Oxygen Therapy: Patient Spontanous Breathing and Patient connected to face mask oxygen  Post-op Assessment: Report given to RN and Post -op Vital signs reviewed and stable  Post vital signs: Reviewed and stable  Last Vitals:  Vitals Value Taken Time  BP 108/57 09/08/19 1130  Temp    Pulse 56 09/08/19 1131  Resp 11 09/08/19 1131  SpO2 100 % 09/08/19 1131  Vitals shown include unvalidated device data.  Last Pain:  Vitals:   09/08/19 0936  TempSrc: Oral  PainSc: 0-No pain      Patients Stated Pain Goal: 3 (09/08/19 0936)  Complications: No apparent anesthesia complications

## 2019-09-08 NOTE — Progress Notes (Signed)
Oral airway removed at 11:40 per protocol.

## 2019-09-08 NOTE — Anesthesia Procedure Notes (Signed)
Procedure Name: LMA Insertion Date/Time: 09/08/2019 10:41 AM Performed by: Marny Lowenstein, CRNA Pre-anesthesia Checklist: Patient identified, Emergency Drugs available, Suction available and Patient being monitored Patient Re-evaluated:Patient Re-evaluated prior to induction Oxygen Delivery Method: Circle system utilized Preoxygenation: Pre-oxygenation with 100% oxygen Induction Type: IV induction Ventilation: Mask ventilation without difficulty LMA: LMA inserted LMA Size: 4.0 Number of attempts: 1 Placement Confirmation: positive ETCO2 and breath sounds checked- equal and bilateral Tube secured with: Tape Dental Injury: Teeth and Oropharynx as per pre-operative assessment

## 2019-09-08 NOTE — Anesthesia Postprocedure Evaluation (Signed)
Anesthesia Post Note  Patient: Sara Frost  Procedure(s) Performed: LEFT WRIST EXOSTOSIS EXCISION (Left Wrist)     Patient location during evaluation: Phase II Anesthesia Type: General Level of consciousness: awake and sedated Pain management: pain level controlled Vital Signs Assessment: post-procedure vital signs reviewed and stable Respiratory status: spontaneous breathing Cardiovascular status: stable Postop Assessment: no apparent nausea or vomiting Anesthetic complications: no    Last Vitals:  Vitals:   09/08/19 1245 09/08/19 1315  BP: 116/71 126/78  Pulse: (!) 54 60  Resp: 12 14  Temp:  37.1 C  SpO2: 98% 100%    Last Pain:  Vitals:   09/08/19 1315  TempSrc:   PainSc: 0-No pain   Pain Goal: Patients Stated Pain Goal: 3 (09/08/19 1230)                 Caren Macadam

## 2019-09-08 NOTE — Op Note (Addendum)
NAME: Sara Frost MEDICAL RECORD NO: [id number] DATE OF BIRTH: [birth year] FACILITY: [facility] LOCATION: [facility] PHYSICIAN: Tennis Must, MD   OPERATIVE REPORT   DATE OF PROCEDURE: 09/08/19    PREOPERATIVE DIAGNOSIS:   Left wrist bony exostosis   POSTOPERATIVE DIAGNOSIS:   Left wrist dorsal bony exostosis and EPL tendon abrasion   PROCEDURE:   1.  Left wrist excision of dorsal bony exostosis 2.  Left EPL transposition   SURGEON:  Leanora Cover, M.D.   ASSISTANT: Daryll Brod, MD   ANESTHESIA:  General   INTRAVENOUS FLUIDS:  Per anesthesia flow sheet.   ESTIMATED BLOOD LOSS:  Minimal.   COMPLICATIONS:  None.   SPECIMENS:  none   TOURNIQUET TIME:    Total Tourniquet Time Documented: Upper Arm (Left) - 26 minutes Total: Upper Arm (Left) - 26 minutes    DISPOSITION:  Stable to PACU.   INDICATIONS: 29 year old female status post ORIF of left distal radius fracture.  She has noticed a prominence on the dorsum of the wrist that is bothersome to her.  She wishes to have it removed.  We discussed excision of the bony exostosis and possible transposition of the EPL tendon if there is any damage to the tendon. Risks, benefits and alternatives of surgery were discussed including the risks of blood loss, infection, damage to nerves, vessels, tendons, ligaments, bone for surgery, need for additional surgery, complications with wound healing, continued pain, stiffness.  She voiced understanding of these risks and elected to proceed.  OPERATIVE COURSE:  After being identified preoperatively by myself,  the patient and I agreed on the procedure and site of the procedure.  The surgical site was marked.  Surgical consent had been signed. She was given IV antibiotics as preoperative antibiotic prophylaxis. She was transferred to the operating room and placed on the operating table in supine position with the Left upper extremity on an arm board.  General anesthesia was  induced by the anesthesiologist.  Left upper extremity was prepped and draped in normal sterile orthopedic fashion.  A surgical pause was performed between the surgeons, anesthesia, and operating room staff and all were in agreement as to the patient, procedure, and site of procedure.  Tourniquet at the proximal aspect of the extremity was inflated to 250 mmHg after exsanguination of the arm with an Esmarch bandage.    Incision was made over the bony exostosis on the dorsum of the wrist.  This is carried into the subcutaneous tissues by spreading technique.  Care was taken to protect any dorsal branches of the radial nerve.  The Beaver blade was used to incise the dorsal retinaculum.  The EPL tendon was identified.  There was some fraying at the radial side.  It was felt the transposition was appropriate.  The EPL was freed up along its course and transposed radial to the Lister's tubercle and bony exostosis.  The soft tissue over the bony exostosis was carefully elevated using the [facility] blade subperiosteally.  The exostosis was then removed with the rongeurs.  This was taken down to the level of the distal radius.  The area was palpated and no bony prominence was noted.  The wound was copiously irrigated with sterile saline.  The retinaculum was repaired using a 4-0 Vicryl suture in a running fashion.  The EPL was left transposed.  Inverted interrupted Vicryl sutures were placed in subcutaneous tissues and skin was closed with a running subcuticular 4-0 Monocryl suture which was augmented with benzoin  and Steri-Strips.  The wound was injected with quarter percent plain Marcaine to aid in postoperative analgesia.  It was then dressed with sterile 4 x 4's and an ABD used as a splint.  This was wrapped with Kerlix and Ace bandage.  The tourniquet was deflated at 26 minutes.  Fingertips were pink with brisk capillary refill after deflation of tourniquet.  The operative  drapes were broken down.  The patient was awoken  from anesthesia safely.  She was transferred back to the stretcher and taken to PACU in stable condition.  I will see her back in the office in 1 week for postoperative followup.  I will give her a prescription for Percocet 5/325 1-2 tabs PO q6 hours prn pain, dispense # 20 which she has taken previously.   Betha Loa, MD Electronically signed, 09/08/19

## 2019-09-08 NOTE — Op Note (Signed)
I assisted Surgeon(s) and Role:    * Betha Loa, MD - Primary    Cindee Salt, MD - Assisting on the Procedure(s): LEFT WRIST EXOSTOSIS EXCISION on 09/08/2019.  I provided assistance on this case as follows: setup, approach, identification of the exostosis and tendon, removal of the exostosis, transposition of the tendon, closure of the wound and application of the dressings.  Electronically signed by: Cindee Salt, MD Date: 09/08/2019 Time: 11:29 AM

## 2019-09-08 NOTE — Anesthesia Preprocedure Evaluation (Signed)
Anesthesia Evaluation  Patient identified by MRN, date of birth, ID band Patient awake    Reviewed: Allergy & Precautions, NPO status , Patient's Chart, lab work & pertinent test results  Airway Mallampati: I  TM Distance: >3 FB Neck ROM: Full    Dental no notable dental hx. (+) Teeth Intact   Pulmonary neg pulmonary ROS,    Pulmonary exam normal breath sounds clear to auscultation       Cardiovascular negative cardio ROS Normal cardiovascular exam Rhythm:Regular Rate:Normal     Neuro/Psych  Headaches, negative psych ROS   GI/Hepatic negative GI ROS, Neg liver ROS,   Endo/Other  negative endocrine ROS  Renal/GU negative Renal ROS  negative genitourinary   Musculoskeletal negative musculoskeletal ROS (+)   Abdominal Normal abdominal exam  (+)   Peds negative pediatric ROS (+)  Hematology negative hematology ROS (+)   Anesthesia Other Findings   Reproductive/Obstetrics negative OB ROS                             Anesthesia Physical  Anesthesia Plan  ASA: I  Anesthesia Plan: General   Post-op Pain Management:  Regional for Post-op pain   Induction: Intravenous  PONV Risk Score and Plan: 4 or greater and Ondansetron, Treatment may vary due to age or medical condition, Dexamethasone and Midazolam  Airway Management Planned: LMA  Additional Equipment: None  Intra-op Plan:   Post-operative Plan:   Informed Consent: I have reviewed the patients History and Physical, chart, labs and discussed the procedure including the risks, benefits and alternatives for the proposed anesthesia with the patient or authorized representative who has indicated his/her understanding and acceptance.     Dental advisory given  Plan Discussed with: CRNA  Anesthesia Plan Comments:         Anesthesia Quick Evaluation

## 2019-09-08 NOTE — Discharge Instructions (Addendum)
No Tylenol before 4:25pm today. No NSAIDS before 6pm today.  [facility] Instructions Hand Surgery  Wound Care: Keep your hand elevated above the level of your heart.  Do not allow it to dangle by your side.  Keep the dressing dry and do not remove it unless your doctor advises you to do so.  He will usually change it at the time of your post-op visit.  Moving your fingers is advised to stimulate circulation but will depend on the site of your surgery.  If you have a splint applied, your doctor will advise you regarding movement.  Activity: Do not drive or operate machinery today.  Rest today and then you may return to your normal activity and work as indicated by your physician.  Diet:  Drink liquids today or eat a light diet.  You may resume a regular diet tomorrow.    General expectations: Pain for two to three days. Fingers may become slightly swollen.  Call your doctor if any of the following occur: Severe pain not relieved by pain medication. Elevated temperature. Dressing soaked with blood. Inability to move fingers. White or bluish color to fingers.  Post Anesthesia Home Care Instructions  Activity: Get plenty of rest for the remainder of the day. A responsible individual must stay with you for 24 hours following the procedure.  For the next 24 hours, DO NOT: -Drive a car -Advertising copywriter -Drink alcoholic beverages -Take any medication unless instructed by your physician -Make any legal decisions or sign important papers.  Meals: Start with liquid foods such as gelatin or soup. Progress to regular foods as tolerated. Avoid greasy, spicy, heavy foods. If nausea and/or vomiting occur, drink only clear liquids until the nausea and/or vomiting subsides. Call your physician if vomiting continues.  Special Instructions/Symptoms: Your throat may feel dry or sore from the anesthesia or the breathing tube placed in your throat during surgery. If this causes discomfort,  gargle with warm salt water. The discomfort should disappear within 24 hours.  If you had a scopolamine patch placed behind your ear for the management of post- operative nausea and/or vomiting:  1. The medication in the patch is effective for 72 hours, after which it should be removed.  Wrap patch in a tissue and discard in the trash. Wash hands thoroughly with soap and water. 2. You may remove the patch earlier than 72 hours if you experience unpleasant side effects which may include dry mouth, dizziness or visual disturbances. 3. Avoid touching the patch. Wash your hands with soap and water after contact with the patch.

## 2019-09-09 ENCOUNTER — Encounter: Payer: Self-pay | Admitting: *Deleted

## 2019-09-19 HISTORY — PX: WRIST ARTHROSCOPY: SUR100

## 2019-11-06 ENCOUNTER — Other Ambulatory Visit: Payer: Self-pay

## 2019-11-06 ENCOUNTER — Encounter (HOSPITAL_COMMUNITY): Payer: Self-pay | Admitting: Emergency Medicine

## 2019-11-06 ENCOUNTER — Ambulatory Visit (INDEPENDENT_AMBULATORY_CARE_PROVIDER_SITE_OTHER): Payer: Self-pay

## 2019-11-06 ENCOUNTER — Ambulatory Visit (HOSPITAL_COMMUNITY)
Admission: EM | Admit: 2019-11-06 | Discharge: 2019-11-06 | Disposition: A | Discharge status: Home / Self Care | Payer: Self-pay | Attending: Family Medicine | Admitting: Family Medicine

## 2019-11-06 DIAGNOSIS — R1111 Vomiting without nausea: Secondary | ICD-10-CM | POA: Insufficient documentation

## 2019-11-06 DIAGNOSIS — R05 Cough: Secondary | ICD-10-CM

## 2019-11-06 DIAGNOSIS — R059 Cough, unspecified: Secondary | ICD-10-CM

## 2019-11-06 DIAGNOSIS — R5383 Other fatigue: Secondary | ICD-10-CM | POA: Insufficient documentation

## 2019-11-06 DIAGNOSIS — R0781 Pleurodynia: Secondary | ICD-10-CM | POA: Insufficient documentation

## 2019-11-06 LAB — COMPREHENSIVE METABOLIC PANEL
ALT: 25 U/L (ref 0–44)
AST: 20 U/L (ref 15–41)
Albumin: 3.9 g/dL (ref 3.5–5.0)
Alkaline Phosphatase: 53 U/L (ref 38–126)
Anion gap: 11 (ref 5–15)
BUN: 7 mg/dL (ref 6–20)
CO2: 23 mmol/L (ref 22–32)
Calcium: 8.9 mg/dL (ref 8.9–10.3)
Chloride: 106 mmol/L (ref 98–111)
Creatinine, Ser: 0.9 mg/dL (ref 0.44–1.00)
GFR calc Af Amer: >60 mL/min (ref >60)
GFR calc non Af Amer: >60 mL/min (ref >60)
Glucose, Bld: 87 mg/dL (ref 70–99)
Potassium: 4 mmol/L (ref 3.5–5.1)
Sodium: 140 mmol/L (ref 135–145)
Total Bilirubin: 0.7 mg/dL (ref 0.3–1.2)
Total Protein: 7.1 g/dL (ref 6.5–8.1)

## 2019-11-06 LAB — CBC WITH DIFFERENTIAL/PLATELET
Abs Immature Granulocytes: 0.05 10*3/uL (ref 0.00–0.07)
Basophils Absolute: 0.1 10*3/uL (ref 0.0–0.1)
Basophils Relative: 0 %
Eosinophils Absolute: 0.6 10*3/uL — ABNORMAL HIGH (ref 0.0–0.5)
Eosinophils Relative: 5 %
HCT: 41.9 % (ref 36.0–46.0)
Hemoglobin: 14.4 g/dL (ref 12.0–15.0)
Immature Granulocytes: 0 %
Lymphocytes Relative: 29 %
Lymphs Abs: 3.5 10*3/uL (ref 0.7–4.0)
MCH: 32.6 pg (ref 26.0–34.0)
MCHC: 34.4 g/dL (ref 30.0–36.0)
MCV: 94.8 fL (ref 80.0–100.0)
Monocytes Absolute: 0.8 10*3/uL (ref 0.1–1.0)
Monocytes Relative: 6 %
Neutro Abs: 7.2 10*3/uL (ref 1.7–7.7)
Neutrophils Relative %: 60 %
Platelets: 299 10*3/uL (ref 150–400)
RBC: 4.42 MIL/uL (ref 3.87–5.11)
RDW: 11.9 % (ref 11.5–15.5)
WBC: 12.2 10*3/uL — ABNORMAL HIGH (ref 4.0–10.5)
nRBC: 0 % (ref 0.0–0.2)

## 2019-11-06 MED ORDER — ONDANSETRON HCL 4 MG PO TABS: 4.0000 mg | ORAL_TABLET | Freq: Four times a day (QID) | ORAL | 0 refills | Status: DC

## 2019-11-06 NOTE — Discharge Instructions (Addendum)
Your chest x-ray was negative today.  We will draw some labs to see if there is any infection anywhere else, and make sure that your kidneys and liver are functioning the way they should.  If symptoms are persisting I would recommend that you follow-up with this office or with primary care.  Report to the emergency room for further evaluation and treatment if you experience trouble swallowing, trouble breathing, other concerning symptoms.
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
## 2019-11-10 ENCOUNTER — Telehealth (HOSPITAL_COMMUNITY): Payer: Self-pay

## 2019-12-15 IMAGING — DX LEFT WRIST - COMPLETE 3+ VIEW
4 series · 4 of 4 positions shown · non-contrast
Comparison: None.

CLINICAL DATA: Pain after fall.

EXAM:
LEFT WRIST - COMPLETE 3+ VIEW

[wrist pa]
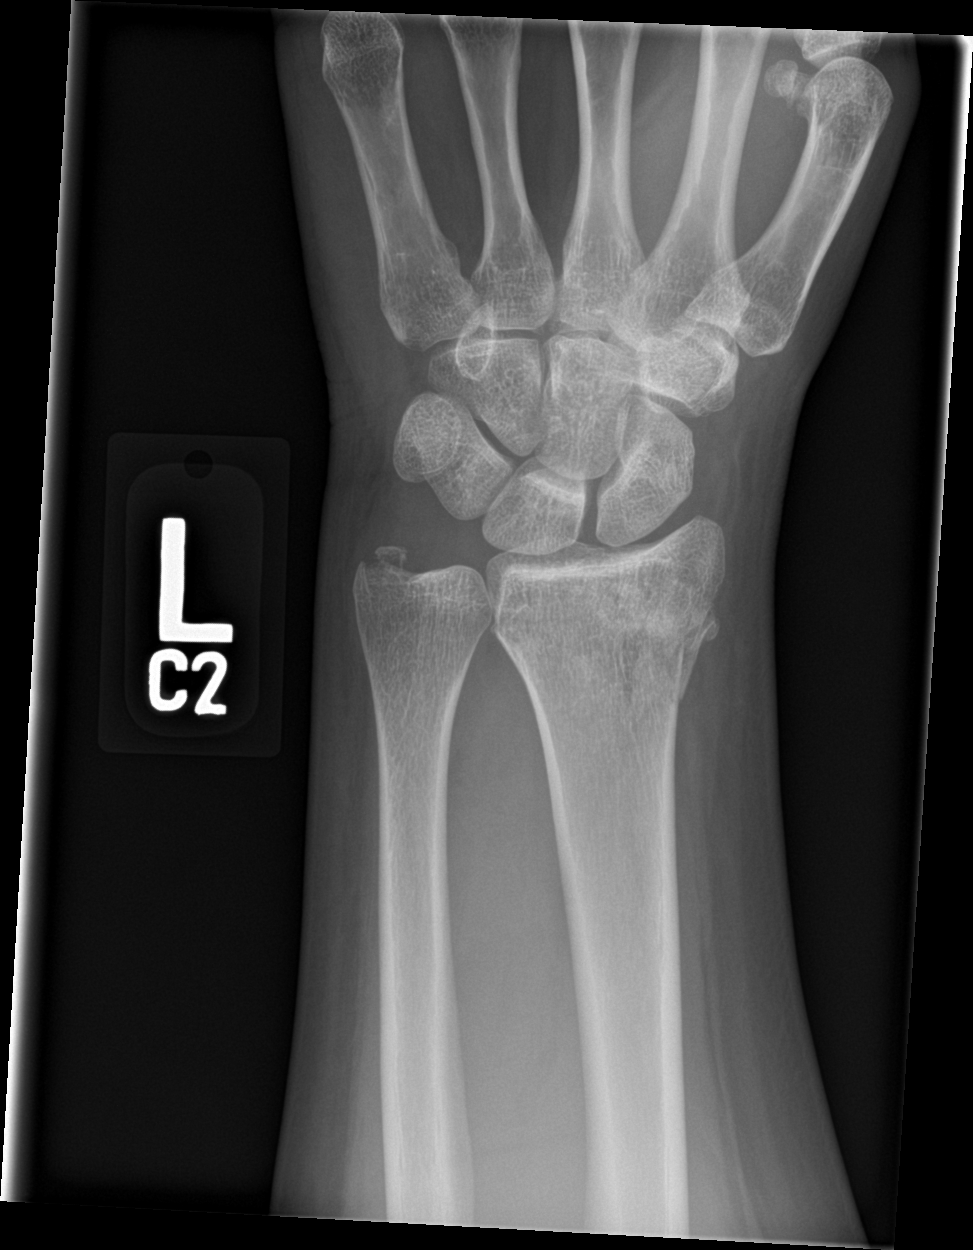

[wrist obl]
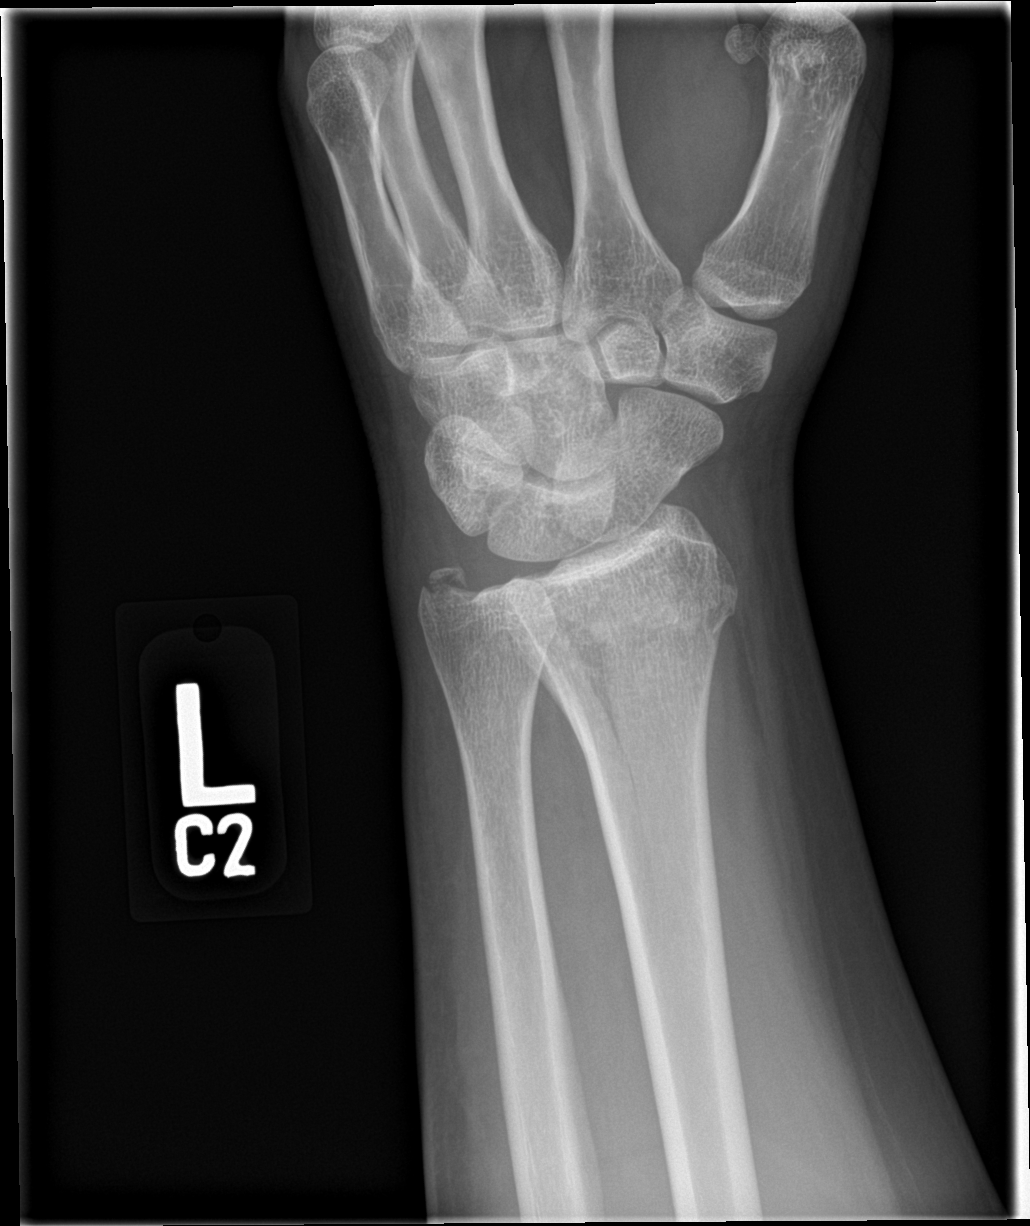

[wrist lat]
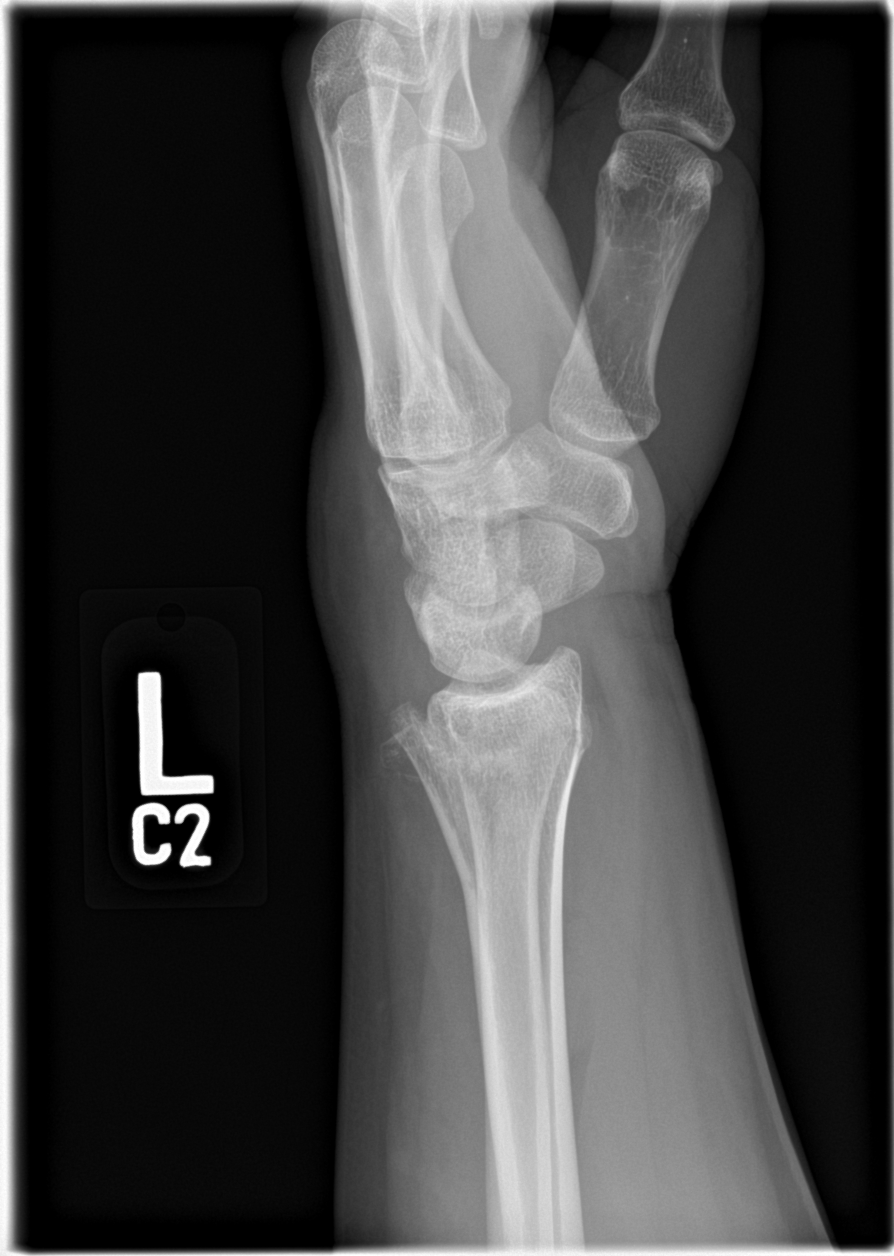

[wrist navicular]
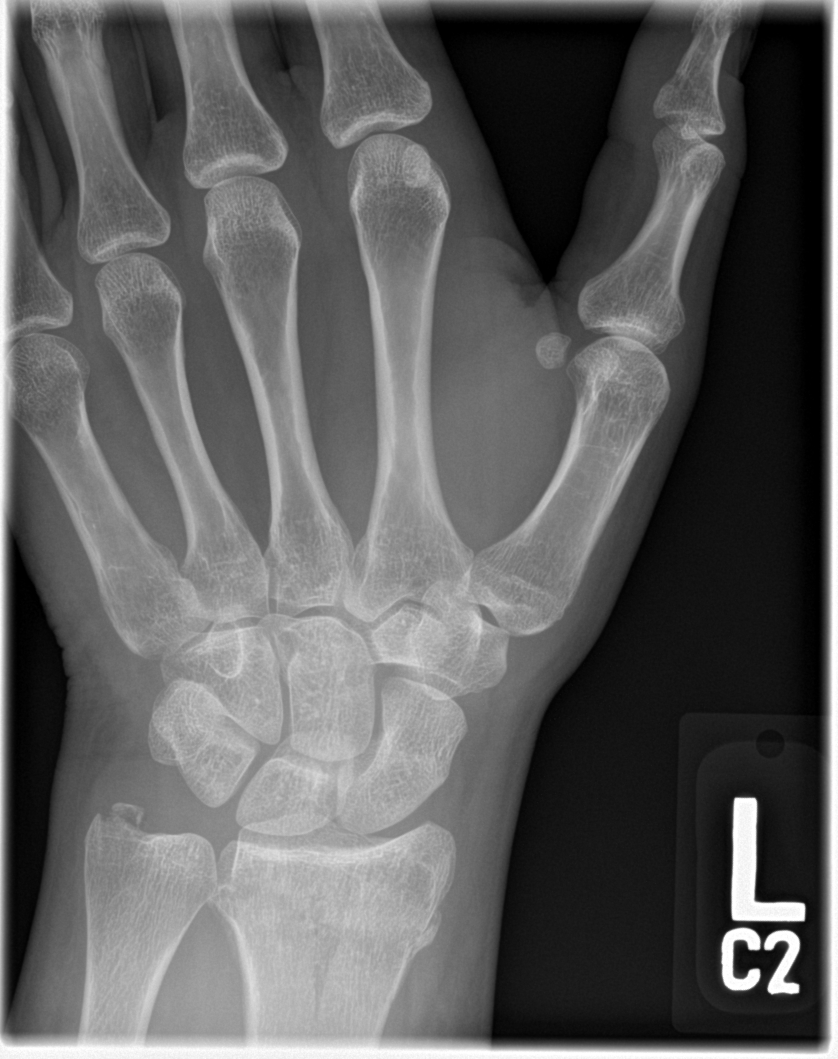

[4 of 4 positions shown; findings below may reference images not displayed]

FINDINGS: There is a fracture through the ulnar styloid. There is a mildly
displaced comminuted fracture through the distal radius. No
dislocation. Soft tissue swelling is seen over the dorsum of the
wrist. No other acute abnormalities.
IMPRESSION: Comminuted displaced fracture through the distal radius. Ulnar
styloid fracture. Soft tissue swelling.

## 2020-01-04 ENCOUNTER — Inpatient Hospital Stay (HOSPITAL_COMMUNITY): Payer: Self-pay

## 2020-01-04 ENCOUNTER — Inpatient Hospital Stay (HOSPITAL_COMMUNITY)
Admission: AD | Admit: 2020-01-04 | Discharge: 2020-01-04 | Disposition: A | Discharge status: Home / Self Care | Payer: Self-pay | Attending: Obstetrics and Gynecology | Admitting: Obstetrics and Gynecology

## 2020-01-04 ENCOUNTER — Other Ambulatory Visit: Payer: Self-pay

## 2020-01-04 ENCOUNTER — Encounter (HOSPITAL_COMMUNITY): Payer: Self-pay | Admitting: Obstetrics and Gynecology

## 2020-01-04 DIAGNOSIS — Z3A01 Less than 8 weeks gestation of pregnancy: Secondary | ICD-10-CM

## 2020-01-04 DIAGNOSIS — O26891 Other specified pregnancy related conditions, first trimester: Secondary | ICD-10-CM

## 2020-01-04 DIAGNOSIS — Z3490 Encounter for supervision of normal pregnancy, unspecified, unspecified trimester: Secondary | ICD-10-CM

## 2020-01-04 DIAGNOSIS — O9A211 Injury, poisoning and certain other consequences of external causes complicating pregnancy, first trimester: Secondary | ICD-10-CM | POA: Insufficient documentation

## 2020-01-04 DIAGNOSIS — W19XXXA Unspecified fall, initial encounter: Secondary | ICD-10-CM | POA: Insufficient documentation

## 2020-01-04 DIAGNOSIS — S3991XA Unspecified injury of abdomen, initial encounter: Secondary | ICD-10-CM | POA: Insufficient documentation

## 2020-01-04 DIAGNOSIS — R109 Unspecified abdominal pain: Secondary | ICD-10-CM

## 2020-01-04 DIAGNOSIS — Z9181 History of falling: Secondary | ICD-10-CM

## 2020-01-04 DIAGNOSIS — Z3A09 9 weeks gestation of pregnancy: Secondary | ICD-10-CM | POA: Insufficient documentation

## 2020-01-04 LAB — POCT PREGNANCY, URINE: Preg Test, Ur: POSITIVE — AB

## 2020-01-04 LAB — URINALYSIS, ROUTINE W REFLEX MICROSCOPIC
Bilirubin Urine: NEGATIVE
Glucose, UA: NEGATIVE mg/dL
Hgb urine dipstick: NEGATIVE
Ketones, ur: NEGATIVE mg/dL
Leukocytes,Ua: NEGATIVE
Nitrite: NEGATIVE
Protein, ur: NEGATIVE mg/dL
Specific Gravity, Urine: 1.014 (ref 1.005–1.030)
pH: 6 (ref 5.0–8.0)
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
## 2020-01-04 NOTE — MAU Note (Signed)
Presents with c/o fall x2 days ago.  Also c/o pain on right lateral side of mid abdomen.  Denies VB.  LMP 11/23/2019.  +HPT x3.

## 2020-01-04 NOTE — Discharge Instructions (Signed)
First Trimester of Pregnancy  The first trimester of pregnancy is from week 1 until the end of week 13 (months 1 through 3). During this time, your baby will begin to develop inside you. At 6-8 weeks, the eyes and face are formed, and the heartbeat can be seen on ultrasound. At the end of 12 weeks, all the baby's organs are formed. Prenatal care is all the medical care you receive before the birth of your baby. Make sure you get good prenatal care and follow all of your doctor's instructions. Follow these instructions at home: Medicines  Take over-the-counter and prescription medicines only as told by your doctor. Some medicines are safe and some medicines are not safe during pregnancy.  Take a prenatal vitamin that contains at least 600 micrograms (mcg) of folic acid.  If you have trouble pooping (constipation), take medicine that will make your stool soft (stool softener) if your doctor approves. Eating and drinking   Eat regular, healthy meals.  Your doctor will tell you the amount of weight gain that is right for you.  Avoid raw meat and uncooked cheese.  If you feel sick to your stomach (nauseous) or throw up (vomit): ? Eat 4 or 5 small meals a day instead of 3 large meals. ? Try eating a few soda crackers. ? Drink liquids between meals instead of during meals.  To prevent constipation: ? Eat foods that are high in fiber, like fresh fruits and vegetables, whole grains, and beans. ? Drink enough fluids to keep your pee (urine) clear or pale yellow. Activity  Exercise only as told by your doctor. Stop exercising if you have cramps or pain in your lower belly (abdomen) or low back.  Do not exercise if it is too hot, too humid, or if you are in a place of great height (high altitude).  Try to avoid standing for long periods of time. Move your legs often if you must stand in one place for a long time.  Avoid heavy lifting.  Wear low-heeled shoes. Sit and stand up  straight.  You can have sex unless your doctor tells you not to. Relieving pain and discomfort  Wear a good support bra if your breasts are sore.  Take warm water baths (sitz baths) to soothe pain or discomfort caused by hemorrhoids. Use hemorrhoid cream if your doctor says it is okay.  Rest with your legs raised if you have leg cramps or low back pain.  If you have puffy, bulging veins (varicose veins) in your legs: ? Wear support hose or compression stockings as told by your doctor. ? Raise (elevate) your feet for 15 minutes, 3-4 times a day. ? Limit salt in your food. Prenatal care  Schedule your prenatal visits by the twelfth week of pregnancy.  Write down your questions. Take them to your prenatal visits.  Keep all your prenatal visits as told by your doctor. This is important. Safety  Wear your seat belt at all times when driving.  Make a list of emergency phone numbers. The list should include numbers for family, friends, the hospital, and police and fire departments. General instructions  Ask your doctor for a referral to a local prenatal class. Begin classes no later than at the start of month 6 of your pregnancy.  Ask for help if you need counseling or if you need help with nutrition. Your doctor can give you advice or tell you where to go for help.  Do not use hot tubs, steam   rooms, or saunas.  Do not douche or use tampons or scented sanitary pads.  Do not cross your legs for long periods of time.  Avoid all herbs and alcohol. Avoid drugs that are not approved by your doctor.  Do not use any tobacco products, including cigarettes, chewing tobacco, and electronic cigarettes. If you need help quitting, ask your doctor. You may get counseling or other support to help you quit.  Avoid cat litter boxes and soil used by cats. These carry germs that can cause birth defects in the baby and can cause a loss of your baby (miscarriage) or stillbirth.  Visit your dentist.  At home, brush your teeth with a soft toothbrush. Be gentle when you floss. Contact a doctor if:  You are dizzy.  You have mild cramps or pressure in your lower belly.  You have a nagging pain in your belly area.  You continue to feel sick to your stomach, you throw up, or you have watery poop (diarrhea).  You have a bad smelling fluid coming from your vagina.  You have pain when you pee (urinate).  You have increased puffiness (swelling) in your face, hands, legs, or ankles. Get help right away if:  You have a fever.  You are leaking fluid from your vagina.  You have spotting or bleeding from your vagina.  You have very bad belly cramping or pain.  You gain or lose weight rapidly.  You throw up blood. It may look like coffee grounds.  You are around people who have German measles, fifth disease, or chickenpox.  You have a very bad headache.  You have shortness of breath.  You have any kind of trauma, such as from a fall or a car accident. Summary  The first trimester of pregnancy is from week 1 until the end of week 13 (months 1 through 3).  To take care of yourself and your unborn baby, you will need to eat healthy meals, take medicines only if your doctor tells you to do so, and do activities that are safe for you and your baby.  Keep all follow-up visits as told by your doctor. This is important as your doctor will have to ensure that your baby is healthy and growing well. This information is not intended to replace advice given to you by your health care provider. Make sure you discuss any questions you have with your health care provider. Document Revised: 11/25/2018 Document Reviewed: 08/12/2016 Elsevier Patient Education  2020 [employer]  Safe Medications in Pregnancy   Acne: Benzoyl Peroxide Salicylic Acid  Backache/Headache: Tylenol: 2 regular strength every 4 hours OR              2 Extra strength every 6  hours  Colds/Coughs/Allergies: Benadryl (alcohol free) 25 mg every 6 hours as needed Breath right strips Claritin Cepacol throat lozenges Chloraseptic throat spray Cold-Eeze- up to three times per day Cough drops, alcohol free Flonase (by prescription only) Guaifenesin Mucinex Robitussin DM (plain only, alcohol free) Saline nasal spray/drops Sudafed (pseudoephedrine) & Actifed ** use only after [redacted] weeks gestation and if you do not have high blood pressure Tylenol Vicks Vaporub Zinc lozenges Zyrtec   Constipation: Colace Ducolax suppositories Fleet enema Glycerin suppositories Metamucil Milk of magnesia Miralax Senokot Smooth move tea  Diarrhea: Kaopectate Imodium A-D  *NO pepto Bismol  Hemorrhoids: Anusol Anusol HC Preparation H Tucks  Indigestion: Tums Maalox Mylanta Zantac  Pepcid  Insomnia: Benadryl (alcohol free) 25mg every 6 hours as needed   Tylenol PM Unisom, no Gelcaps  Leg Cramps: Tums MagGel  Nausea/Vomiting:  Bonine Dramamine Emetrol Ginger extract Sea bands Meclizine  Nausea medication to take during pregnancy:  Unisom (doxylamine succinate 25 mg tablets) Take one tablet daily at bedtime. If symptoms are not adequately controlled, the dose can be increased to a maximum recommended dose of two tablets daily (1/2 tablet in the morning, 1/2 tablet mid-afternoon and one at bedtime). Vitamin B6 100mg tablets. Take one tablet twice a day (up to 200 mg per day).  Skin Rashes: Aveeno products Benadryl cream or 25mg every 6 hours as needed Calamine Lotion 1% cortisone cream  Yeast infection: Gyne-lotrimin 7 Monistat 7   **If taking multiple medications, please check labels to avoid duplicating the same active ingredients **take medication as directed on the label ** Do not exceed 4000 mg of tylenol in 24 hours **Do not take medications that contain aspirin or ibuprofen   

## 2020-01-09 ENCOUNTER — Other Ambulatory Visit: Payer: Self-pay

## 2020-01-09 ENCOUNTER — Encounter (HOSPITAL_COMMUNITY): Payer: Self-pay | Admitting: Obstetrics and Gynecology

## 2020-01-09 ENCOUNTER — Inpatient Hospital Stay (HOSPITAL_COMMUNITY)
Admission: AD | Admit: 2020-01-09 | Discharge: 2020-01-09 | Disposition: A | Discharge status: Home / Self Care | Payer: Self-pay | Attending: Obstetrics and Gynecology | Admitting: Obstetrics and Gynecology

## 2020-01-09 ENCOUNTER — Inpatient Hospital Stay (HOSPITAL_COMMUNITY): Payer: Self-pay

## 2020-01-09 DIAGNOSIS — O209 Hemorrhage in early pregnancy, unspecified: Secondary | ICD-10-CM

## 2020-01-09 DIAGNOSIS — K760 Fatty (change of) liver, not elsewhere classified: Secondary | ICD-10-CM | POA: Insufficient documentation

## 2020-01-09 DIAGNOSIS — O26611 Liver and biliary tract disorders in pregnancy, first trimester: Secondary | ICD-10-CM | POA: Insufficient documentation

## 2020-01-09 DIAGNOSIS — Z3A09 9 weeks gestation of pregnancy: Secondary | ICD-10-CM | POA: Insufficient documentation

## 2020-01-09 DIAGNOSIS — Z79899 Other long term (current) drug therapy: Secondary | ICD-10-CM | POA: Insufficient documentation

## 2020-01-09 DIAGNOSIS — O039 Complete or unspecified spontaneous abortion without complication: Secondary | ICD-10-CM | POA: Insufficient documentation

## 2020-01-09 MED ORDER — HYDROMORPHONE HCL 1 MG/ML IJ SOLN
1.0000 mg | Freq: Once | INTRAMUSCULAR | Status: AC
  Administered 2020-01-09: 1 mg via INTRAMUSCULAR
  Filled 2020-01-09: qty 1

## 2020-01-09 MED ORDER — PROMETHAZINE HCL 25 MG/ML IJ SOLN
12.5000 mg | Freq: Once | INTRAMUSCULAR | Status: AC
  Administered 2020-01-09: 12.5 mg via INTRAMUSCULAR
  Filled 2020-01-09: qty 1

## 2020-01-09 NOTE — MAU Note (Signed)
Patient stating that she started having VB approx 0830 and the pain started on her way to MAU.

## 2020-01-09 NOTE — MAU Note (Signed)
Discharge instructions reviewed with patient, provider note given to patient. Patient verbalizes understanding of f/u and s/s to return: heavy VB, increased abdominal pain, fever.

## 2020-01-09 NOTE — MAU Provider Note (Signed)
Chief Complaint: Abdominal Pain (started after the VB 7/10 pain) and Vaginal Bleeding (started an hour ago)   First Provider Initiated Contact with Patient 01/09/20 1152     SUBJECTIVE HPI: Sara Frost is a 29 y.o. G2P0010 at [redacted]w[redacted]d who presents to [facility] reporting abdominal pain and vaginal bleeding.  Symptoms started about an hour prior to arrival.  Reports going through 3 pads this morning.  Rates abdominal cramping 7 out of 10.  Denies any other symptoms.  Location: abdomen Quality: cramping Severity: 7/10 on pain scale Duration: hours Timing: intermittent Modifying factors: none Associated signs and symptoms: vaginal bleeding.   Past Medical History:  Diagnosis Date  . Fatty liver    OB History  Gravida Para Term Preterm AB Living  2 0 0 0 1 0  SAB TAB Ectopic Multiple Live Births  0 1 0 0 0    # Outcome Date GA Lbr Len/2nd Weight Sex Delivery Anes PTL Lv  2 Current           1 TAB            Past Surgical History:  Procedure Laterality Date  . BONE EXOSTOSIS EXCISION Left 09/08/2019   Procedure: LEFT WRIST EXOSTOSIS EXCISION;  Surgeon: Betha Loa, MD;  Location: [facility];  Service: Orthopedics;  Laterality: Left;  . CLAVICLE SURGERY    . OPEN REDUCTION INTERNAL FIXATION (ORIF) DISTAL RADIAL FRACTURE Left 02/24/2019   Procedure: OPEN REDUCTION INTERNAL FIXATION (ORIF) LEFT DISTAL RADIAL FRACTURE;  Surgeon: Betha Loa, MD;  Location: [facility];  Service: Orthopedics;  Laterality: Left;  . WRIST ARTHROSCOPY  09/2019   Social History   Socioeconomic History  . Marital status: Single    Spouse name: Not on file  . Number of children: Not on file  . Years of education: Not on file  . Highest education level: Not on file  Occupational History  . Not on file  Tobacco Use  . Smoking status: Never Smoker  . Smokeless tobacco: Never Used  Substance and Sexual Activity  . Alcohol use: Never  . Drug use: Yes    Types:  Marijuana  . Sexual activity: Yes  Other Topics Concern  . Not on file  Social History Narrative  . Not on file   Social Determinants of Health   Financial Resource Strain:   . Difficulty of Paying Living Expenses:   Food Insecurity:   . Worried About Programme researcher, broadcasting/film/video in the Last Year:   . Barista in the Last Year:   Transportation Needs:   . Freight forwarder (Medical):   Marland Kitchen Lack of Transportation (Non-Medical):   Physical Activity:   . Days of Exercise per Week:   . Minutes of Exercise per Session:   Stress:   . Feeling of Stress :   Social Connections:   . Frequency of Communication with Friends and Family:   . Frequency of Social Gatherings with Friends and Family:   . Attends Religious Services:   . Active Member of Clubs or Organizations:   . Attends Banker Meetings:   Marland Kitchen Marital Status:   Intimate Partner Violence:   . Fear of Current or Ex-Partner:   . Emotionally Abused:   Marland Kitchen Physically Abused:   . Sexually Abused:    Family History  Problem Relation Age of Onset  . Lung cancer Mother   . Liver cancer Maternal Grandmother   . Pancreatic cancer Paternal Grandfather  No current facility-administered medications on file prior to encounter.   Current Outpatient Medications on File Prior to Encounter  Medication Sig Dispense Refill  . loratadine (CLARITIN) 10 MG tablet Take 10 mg by mouth daily as needed for allergies.    Marland Kitchen ondansetron (ZOFRAN) 4 MG tablet Take 1 tablet (4 mg total) by mouth every 6 (six) hours. 12 tablet 0   Allergies  Allergen Reactions  . Penicillins Nausea Only    Has patient had a PCN reaction causing immediate rash, facial/tongue/throat swelling, SOB or lightheadedness with hypotension: Y Has patient had a PCN reaction causing severe rash involving mucus membranes or skin necrosis: Y Has patient had a PCN reaction that required hospitalization: N Has patient had a PCN reaction occurring within the last 10  years: Y If all of the above answers are "NO", then may proceed with Cephalosporin use.   . Vicodin [Hydrocodone-Acetaminophen] Nausea And Vomiting    I have reviewed patient's Past Medical Hx, Surgical Hx, Family Hx, Social Hx, medications and allergies.   Review of Systems  Constitutional: Negative.   Gastrointestinal: Positive for abdominal pain. Negative for constipation, diarrhea, nausea and vomiting.  Genitourinary: Positive for vaginal bleeding. Negative for dysuria and vaginal discharge.    OBJECTIVE Patient Vitals for the past 24 hrs:  BP Temp Temp src Pulse Resp SpO2 Height Weight  01/09/20 1330 121/75 (!) 97.4 F (36.3 C) Oral (!) 49 17 100 % -- --  01/09/20 1010 135/75 -- -- (!) 50 -- -- -- --  01/09/20 0938 (!) 142/96 97.7 F (36.5 C) Oral (!) 52 18 -- 5\' 8"  (1.727 m) 78.8 kg  01/09/20 0937 -- -- -- -- -- 100 % -- --   Constitutional: Well-developed, well-nourished female in no acute distress.  Cardiovascular: normal rate & rhythm, no murmur Respiratory: normal rate and effort. Lung sounds clear throughout GI: Abd soft, non-tender, Pos BS x 4. No guarding or rebound tenderness MS: Extremities nontender, no edema, normal ROM Neurologic: Alert and oriented x 4.  GU:   Small amount of dark red blood   LAB RESULTS No results found for this or any previous visit (from the past 24 hour(s)).  IMAGING US OB Transvaginal  Result Date: 01/09/2020 CLINICAL DATA:  Vaginal bleeding, lower abdominal pain, first trimester of pregnancy. EXAM: TRANSVAGINAL OB ULTRASOUND TECHNIQUE: Transvaginal ultrasound was performed for complete evaluation of the gestation as well as the maternal uterus, adnexal regions, and pelvic cul-de-sac. COMPARISON:  Jan 04, 2020. FINDINGS: Intrauterine gestational sac: None Yolk sac:  Not Visualized. Embryo:  Not Visualized. Cardiac Activity: Not Visualized. Maternal uterus/adnexae: Ovaries are unremarkable. No free fluid is noted. Intrauterine fluid  collection with yolk sac noted on prior exam is no longer visualized. Endometrial thickening is noted. IMPRESSION: Intrauterine gestational sac with yolk sac noted on prior exam is no longer visualized. Thickened endometrium remains. These findings are most consistent with missed abortion. Electronically Signed   By: Marijo Conception M.D.   On: 01/09/2020 12:57    MAU COURSE Orders Placed This Encounter  Procedures  . US OB Transvaginal  . Discharge patient   Meds ordered this encounter  Medications  . HYDROmorphone (DILAUDID) injection 1 mg  . promethazine (PHENERGAN) injection 12.5 mg    MDM RH positive Per imaging done last week, had an intrauterine gestational sac with a yolk sac.  Ultrasound performed today no longer shows gestational sac confirming miscarriage. Bleeding stable.  Patient is bradycardic, per review review of records heart rate  was in the 50s during her last visit as well, and she is asymptomatic.  ASSESSMENT 1. Miscarriage   2. Vaginal bleeding in pregnancy, first trimester     PLAN Discharge home in stable condition. Bleeding & infection precautions Pelvic rest Msg to [facility] for SAB f/u appointment  Allergies as of 01/09/2020      Reactions   Penicillins Nausea Only   Has patient had a PCN reaction causing immediate rash, facial/tongue/throat swelling, SOB or lightheadedness with hypotension: Y Has patient had a PCN reaction causing severe rash involving mucus membranes or skin necrosis: Y Has patient had a PCN reaction that required hospitalization: N Has patient had a PCN reaction occurring within the last 10 years: Y If all of the above answers are "NO", then may proceed with Cephalosporin use.   Vicodin [hydrocodone-acetaminophen] Nausea And Vomiting      Medication List    TAKE these medications   loratadine 10 MG tablet Commonly known as: CLARITIN Take 10 mg by mouth daily as needed for allergies.   ondansetron 4 MG tablet Commonly known as:  ZOFRAN Take 1 tablet (4 mg total) by mouth every 6 (six) hours.        Judeth Horn, NP 01/09/2020  1:57 PM

## 2020-01-09 NOTE — Discharge Instructions (Signed)
Miscarriage A miscarriage is the loss of an unborn baby (fetus) before the 20th week of pregnancy. Most miscarriages happen during the first 3 months of pregnancy. Sometimes, a miscarriage can happen before a woman knows that she is pregnant. Having a miscarriage can be an emotional experience. If you have had a miscarriage, talk with your health care provider about any questions you may have about miscarrying, the grieving process, and your plans for future pregnancy. What are the causes? A miscarriage may be caused by:  Problems with the genes or chromosomes of the fetus. These problems make it impossible for the baby to develop normally. They are often the result of random errors that occur early in the development of the baby, and are not passed from parent to child (not inherited).  Infection of the cervix or uterus.  Conditions that affect hormone balance in the body.  Problems with the cervix, such as the cervix opening and thinning before pregnancy is at term (cervical insufficiency).  Problems with the uterus. These may include: ? A uterus with an abnormal shape. ? Fibroids in the uterus. ? Congenital abnormalities. These are problems that were present at birth.  Certain medical conditions.  Smoking, drinking alcohol, or using drugs.  Injury (trauma). In many cases, the cause of a miscarriage is not known. What are the signs or symptoms? Symptoms of this condition include:  Vaginal bleeding or spotting, with or without cramps or pain.  Pain or cramping in the abdomen or lower back.  Passing fluid, tissue, or blood clots from the vagina. How is this diagnosed? This condition may be diagnosed based on:  A physical exam.  Ultrasound.  Blood tests.  Urine tests. How is this treated? Treatment for a miscarriage is sometimes not necessary if you naturally pass all the tissue that was in your uterus. If necessary, this condition may be treated with:  Dilation and  curettage (D&C). This is a procedure in which the cervix is stretched open and the lining of the uterus (endometrium) is scraped. This is done only if tissue from the fetus or placenta remains in the body (incomplete miscarriage).  Medicines, such as: ? Antibiotic medicine, to treat infection. ? Medicine to help the body pass any remaining tissue. ? Medicine to reduce (contract) the size of the uterus. These medicines may be given if you have a lot of bleeding. If you have Rh negative blood and your baby was Rh positive, you will need a shot of a medicine called Rh immunoglobulinto protect your future babies from Rh blood problems. "Rh-negative" and "Rh-positive" refer to whether or not the blood has a specific protein found on the surface of red blood cells (Rh factor). Follow these instructions at home: Medicines   Take over-the-counter and prescription medicines only as told by your health care provider.  If you were prescribed antibiotic medicine, take it as told by your health care provider. Do not stop taking the antibiotic even if you start to feel better.  Do not take NSAIDs, such as aspirin and ibuprofen, unless they are approved by your health care provider. These medicines can cause bleeding. Activity  Rest as directed. Ask your health care provider what activities are safe for you.  Have someone help with home and family responsibilities during this time. General instructions  Keep track of the number of sanitary pads you use each day and how soaked (saturated) they are. Write down this information.  Monitor the amount of tissue or blood clots that   you pass from your vagina. Save any large amounts of tissue for your health care provider to examine.  Do not use tampons, douche, or have sex until your health care provider approves.  To help you and your partner with the process of grieving, talk with your health care provider or seek counseling.  When you are ready, meet with  your health care provider to discuss any important steps you should take for your health. Also, discuss steps you should take to have a healthy pregnancy in the future.  Keep all follow-up visits as told by your health care provider. This is important. Where to find more information  The American Congress of Obstetricians and Gynecologists: www.acog.org  [place]. Department of Health and Human Services Office of Women's Health: www.womenshealth.gov Contact a health care provider if:  You have a fever or chills.  You have a foul smelling vaginal discharge.  You have more bleeding instead of less. Get help right away if:  You have severe cramps or pain in your back or abdomen.  You pass blood clots or tissue from your vagina that is walnut-sized or larger.  You soak more than 1 regular sanitary pad in an hour.  You become light-headed or weak.  You pass out.  You have feelings of sadness that take over your thoughts, or you have thoughts of hurting yourself. Summary  Most miscarriages happen in the first 3 months of pregnancy. Sometimes miscarriage happens before a woman even knows that she is pregnant.  Follow your health care provider's instruction for home care. Keep all follow-up appointments.  To help you and your partner with the process of grieving, talk with your health care provider or seek counseling. This information is not intended to replace advice given to you by your health care provider. Make sure you discuss any questions you have with your health care provider. Document Revised: 11/26/2018 Document Reviewed: 09/09/2016 Elsevier Patient Education  2020 Elsevier Inc.   Managing Pregnancy Loss Pregnancy loss can happen any time during a pregnancy. Often the cause is not known. It is rarely because of anything you did. Pregnancy loss in early pregnancy (during the first trimester) is called a miscarriage. This type of pregnancy loss is the most common. Pregnancy loss  that happens after 20 weeks of pregnancy is called fetal demise if the baby's heart stops beating before birth. Fetal demise is much less common. Some women experience spontaneous labor shortly after fetal demise resulting in a stillborn birth (stillbirth). Any pregnancy loss can be devastating. You will need to recover both physically and emotionally. Most women are able to get pregnant again after a pregnancy loss and deliver a healthy baby. How to manage emotional recovery  Pregnancy loss is very hard emotionally. You may feel many different emotions while you grieve. You may feel sad and angry. You may also feel guilty. It is normal to have periods of crying. Emotional recovery can take longer than physical recovery. It is different for everyone. Taking these steps can help you in managing this loss:  Remember that it is unlikely you did anything to cause the pregnancy loss.  Share your thoughts and feelings with friends, family, and your partner. Remember that your partner is also recovering emotionally.  Make sure you have a good support system. Do not spend too much time alone.  Meet with a pregnancy loss counselor or join a pregnancy loss support group.  Get enough sleep and eat a healthy diet. Return to regular exercise   when you have recovered physically.  Do not use drugs or alcohol to manage your emotions.  Consider seeing a mental health professional to help you recover emotionally.  Ask a friend or loved one to help you decide what to do with any clothing and nursery items you received for your baby. In the case of a stillbirth, many women benefit from taking additional steps in the grieving process. You may want to:  Hold your baby after the birth.  Name your baby.  Request a birth certificate.  Create a keepsake such as handprints or footprints.  Dress your baby and have a picture taken.  Make funeral arrangements.  Ask for a baptism or blessing. Hospitals have  staff members who can help you with all these arrangements. How to recognize emotional stress It is normal to have emotional stress after a pregnancy loss. But emotional stress that lasts a long time or becomes severe requires treatment. Watch out for these signs of severe emotional stress:  Sadness, anger, or guilt that is not going away and is interfering with your normal activities.  Relationship problems that have occurred or gotten worse since the pregnancy loss.  Signs of depression that last longer than 2 weeks. These may include: ? Sadness. ? Anxiety. ? Hopelessness. ? Loss of interest in activities you enjoy. ? Inability to concentrate. ? Trouble sleeping or sleeping too much. ? Loss of appetite or overeating. ? Thoughts of death or of hurting yourself. Follow these instructions at home:  Take over-the-counter and prescription medicines only as told by your health care provider.  Rest at home until your energy level returns. Return to your normal activities as told by your health care provider. Ask your health care provider what activities are safe for you.  When you are ready, meet with your health care provider to discuss steps to take for a future pregnancy.  Keep all follow-up visits as told by your health care provider. This is important. Where to find support  To help you and your partner with the process of grieving, talk with your health care provider or seek counseling.  Consider meeting with others who have experienced pregnancy loss. Ask your health care provider about support groups and resources. Where to find more information  [place]. Department of Health and Human Services Office on Women's Health: www.womenshealth.gov  American Pregnancy Association: www.americanpregnancy.org Contact a health care provider if:  You continue to experience grief, sadness, or lack of motivation for everyday activities, and those feelings do not improve over time.  You are  struggling to recover emotionally, especially if you are using alcohol or substances to help. Get help right away if:  You have thoughts of hurting yourself or others. If you ever feel like you may hurt yourself or others, or have thoughts about taking your own life, get help right away. You can go to your nearest emergency department or call:  Your local emergency services (911 in the [place]  A suicide crisis helpline, such as the National Suicide Prevention Lifeline at 1-800-273-8255. This is open 24 hours a day. Summary  Any pregnancy loss can be difficult physically and emotionally.  You may experience many different emotions while you grieve. Emotional recovery can last longer than physical recovery.  It is normal to have emotional stress after a pregnancy loss. But emotional stress that lasts a long time or becomes severe requires treatment.  See your health care provider if you are struggling emotionally after a pregnancy loss. This information   is not intended to replace advice given to you by your health care provider. Make sure you discuss any questions you have with your health care provider. Document Revised: 11/24/2018 Document Reviewed: 10/15/2017 Elsevier Patient Education  2020 Elsevier Inc.  

## 2020-02-01 ENCOUNTER — Ambulatory Visit: Payer: Self-pay | Admitting: Women's Health

## 2020-07-21 ENCOUNTER — Inpatient Hospital Stay (HOSPITAL_COMMUNITY)
Admission: AD | Admit: 2020-07-21 | Discharge: 2020-07-21 | Disposition: A | Discharge status: Home / Self Care | Payer: Self-pay | Attending: Obstetrics and Gynecology | Admitting: Obstetrics and Gynecology

## 2020-07-21 ENCOUNTER — Other Ambulatory Visit: Payer: Self-pay

## 2020-07-21 ENCOUNTER — Encounter (HOSPITAL_COMMUNITY): Payer: Self-pay | Admitting: Obstetrics and Gynecology

## 2020-07-21 ENCOUNTER — Inpatient Hospital Stay (HOSPITAL_COMMUNITY): Payer: Self-pay

## 2020-07-21 DIAGNOSIS — F1290 Cannabis use, unspecified, uncomplicated: Secondary | ICD-10-CM | POA: Insufficient documentation

## 2020-07-21 DIAGNOSIS — R109 Unspecified abdominal pain: Secondary | ICD-10-CM | POA: Insufficient documentation

## 2020-07-21 DIAGNOSIS — O10919 Unspecified pre-existing hypertension complicating pregnancy, unspecified trimester: Secondary | ICD-10-CM

## 2020-07-21 DIAGNOSIS — O10911 Unspecified pre-existing hypertension complicating pregnancy, first trimester: Secondary | ICD-10-CM | POA: Insufficient documentation

## 2020-07-21 DIAGNOSIS — Z885 Allergy status to narcotic agent status: Secondary | ICD-10-CM | POA: Insufficient documentation

## 2020-07-21 DIAGNOSIS — O219 Vomiting of pregnancy, unspecified: Secondary | ICD-10-CM | POA: Insufficient documentation

## 2020-07-21 DIAGNOSIS — O26891 Other specified pregnancy related conditions, first trimester: Secondary | ICD-10-CM | POA: Insufficient documentation

## 2020-07-21 DIAGNOSIS — R197 Diarrhea, unspecified: Secondary | ICD-10-CM | POA: Insufficient documentation

## 2020-07-21 DIAGNOSIS — Z3A09 9 weeks gestation of pregnancy: Secondary | ICD-10-CM | POA: Insufficient documentation

## 2020-07-21 DIAGNOSIS — O99891 Other specified diseases and conditions complicating pregnancy: Secondary | ICD-10-CM | POA: Insufficient documentation

## 2020-07-21 DIAGNOSIS — O99321 Drug use complicating pregnancy, first trimester: Secondary | ICD-10-CM | POA: Insufficient documentation

## 2020-07-21 DIAGNOSIS — O0931 Supervision of pregnancy with insufficient antenatal care, first trimester: Secondary | ICD-10-CM | POA: Insufficient documentation

## 2020-07-21 DIAGNOSIS — O26899 Other specified pregnancy related conditions, unspecified trimester: Secondary | ICD-10-CM

## 2020-07-21 LAB — COMPREHENSIVE METABOLIC PANEL
ALT: 15 U/L (ref 0–44)
AST: 17 U/L (ref 15–41)
Albumin: 3.5 g/dL (ref 3.5–5.0)
Alkaline Phosphatase: 46 U/L (ref 38–126)
Anion gap: 12 (ref 5–15)
BUN: 8 mg/dL (ref 6–20)
CO2: 20 mmol/L — ABNORMAL LOW (ref 22–32)
Calcium: 9 mg/dL (ref 8.9–10.3)
Chloride: 103 mmol/L (ref 98–111)
Creatinine, Ser: 0.53 mg/dL (ref 0.44–1.00)
GFR, Estimated: >60 mL/min (ref >60)
Glucose, Bld: 93 mg/dL (ref 70–99)
Potassium: 4.1 mmol/L (ref 3.5–5.1)
Sodium: 135 mmol/L (ref 135–145)
Total Bilirubin: 0.6 mg/dL (ref 0.3–1.2)
Total Protein: 6.8 g/dL (ref 6.5–8.1)

## 2020-07-21 LAB — URINALYSIS, ROUTINE W REFLEX MICROSCOPIC
Bilirubin Urine: NEGATIVE
Glucose, UA: NEGATIVE mg/dL
Hgb urine dipstick: NEGATIVE
Ketones, ur: 20 mg/dL — AB
Leukocytes,Ua: NEGATIVE
Nitrite: NEGATIVE
Protein, ur: 30 mg/dL — AB
Specific Gravity, Urine: 1.027 (ref 1.005–1.030)
pH: 7 (ref 5.0–8.0)

## 2020-07-21 LAB — CBC
HCT: 41.5 % (ref 36.0–46.0)
Hemoglobin: 14.4 g/dL (ref 12.0–15.0)
MCH: 32.3 pg (ref 26.0–34.0)
MCHC: 34.7 g/dL (ref 30.0–36.0)
MCV: 93 fL (ref 80.0–100.0)
Platelets: 254 10*3/uL (ref 150–400)
RBC: 4.46 MIL/uL (ref 3.87–5.11)
RDW: 11.6 % (ref 11.5–15.5)
WBC: 17.2 10*3/uL — ABNORMAL HIGH (ref 4.0–10.5)
nRBC: 0 % (ref 0.0–0.2)

## 2020-07-21 LAB — WET PREP, GENITAL
Sperm: NONE SEEN
Trich, Wet Prep: NONE SEEN
WBC, Wet Prep HPF POC: NONE SEEN
Yeast Wet Prep HPF POC: NONE SEEN

## 2020-07-21 LAB — HCG, QUANTITATIVE, PREGNANCY: hCG, Beta Chain, Quant, S: 174982 m[IU]/mL — ABNORMAL HIGH (ref <5)

## 2020-07-21 LAB — PREGNANCY, URINE: Preg Test, Ur: POSITIVE — AB

## 2020-07-21 LAB — TSH: TSH: 0.971 u[IU]/mL (ref 0.350–4.500)

## 2020-07-21 MED ORDER — FAMOTIDINE IN NACL 20-0.9 MG/50ML-% IV SOLN
20.0000 mg | Freq: Once | INTRAVENOUS | Status: AC
  Administered 2020-07-21: 20 mg via INTRAVENOUS
  Filled 2020-07-21: qty 50

## 2020-07-21 MED ORDER — ONDANSETRON 4 MG PO TBDP: 4.0000 mg | ORAL_TABLET | Freq: Three times a day (TID) | ORAL | 0 refills | Status: DC | PRN

## 2020-07-21 MED ORDER — FAMOTIDINE 20 MG PO TABS: 20.0000 mg | ORAL_TABLET | Freq: Every day | ORAL | 1 refills | Status: DC

## 2020-07-21 MED ORDER — ONDANSETRON HCL 4 MG/2ML IJ SOLN
4.0000 mg | Freq: Once | INTRAMUSCULAR | Status: AC
  Administered 2020-07-21: 4 mg via INTRAVENOUS
  Filled 2020-07-21: qty 2

## 2020-07-21 MED ORDER — LACTATED RINGERS IV BOLUS
1000.0000 mL | Freq: Once | INTRAVENOUS | Status: AC
  Administered 2020-07-21: 1000 mL via INTRAVENOUS

## 2020-07-21 NOTE — MAU Provider Note (Signed)
History     CSN: [id number]  Arrival date and time: 07/21/20 1057   First Provider Initiated Contact with Patient 07/21/20 1326      Chief Complaint  Patient presents with  . Nausea  . Emesis  . Diarrhea   Ms. Sara Frost is a 29 y.o. G3P0010 at [redacted]w[redacted]d who presents to MAU for nausea, vomiting, diarrhea. Patient reports the nausea and vomiting started about 3 weeks ago and the diarrhea started yesterday. Patient reports she ate a tuna melt yesterday, and stared having diarrhea afterwards. Patient reports diarrhea x2 since yesterday, which she reports is watery. Patient endorses constant nausea, patient reports vomiting x20 in the past 24 hours. Patient reports she cannot keep food or drink down for longer than 30 minutes. Patient reports she has been able to keep down [employer]. Patient has tried Peppermint water, ginger ale, Sprite, "morning sickness altoids", ginger chews, cinnamon, lemon water and watermelon for treatment without success. Patient admits to smoking marijuana.  Pt denies VB, LOF, ctx, decreased FM, vaginal discharge/odor/itching. Pt denies abdominal pain, constipation, or urinary problems. Pt denies fever, chills, fatigue, sweating or changes in appetite. Pt denies SOB or chest pain. Pt denies dizziness, HA, light-headedness, weakness.  Problems this pregnancy include: pt has not yet been seen. Allergies? PCN, Vicodin Current medications/supplements? PNV (pt reports she has been able to keep these down) Prenatal care provider? Pt requests list of OB providers   OB History    Gravida  3   Para  0   Term  0   Preterm  0   AB  1   Living  0     SAB  0   TAB  1   Ectopic  0   Multiple  0   Live Births  0           Past Medical History:  Diagnosis Date  . Fatty liver     Past Surgical History:  Procedure Laterality Date  . BONE EXOSTOSIS EXCISION Left 09/08/2019   Procedure: LEFT WRIST EXOSTOSIS EXCISION;  Surgeon: Betha Loa, MD;   Location: [facility];  Service: Orthopedics;  Laterality: Left;  . CLAVICLE SURGERY    . OPEN REDUCTION INTERNAL FIXATION (ORIF) DISTAL RADIAL FRACTURE Left 02/24/2019   Procedure: OPEN REDUCTION INTERNAL FIXATION (ORIF) LEFT DISTAL RADIAL FRACTURE;  Surgeon: Betha Loa, MD;  Location: [facility];  Service: Orthopedics;  Laterality: Left;  . WRIST ARTHROSCOPY  09/2019    Family History  Problem Relation Age of Onset  . Lung cancer Mother   . Liver cancer Maternal Grandmother   . Pancreatic cancer Paternal Grandfather     Social History   Tobacco Use  . Smoking status: Never Smoker  . Smokeless tobacco: Never Used  Vaping Use  . Vaping Use: Never used  Substance Use Topics  . Alcohol use: Never  . Drug use: Yes    Types: Marijuana    Allergies:  Allergies  Allergen Reactions  . Penicillins Nausea Only    Has patient had a PCN reaction causing immediate rash, facial/tongue/throat swelling, SOB or lightheadedness with hypotension: Y Has patient had a PCN reaction causing severe rash involving mucus membranes or skin necrosis: Y Has patient had a PCN reaction that required hospitalization: N Has patient had a PCN reaction occurring within the last 10 years: Y If all of the above answers are "NO", then may proceed with Cephalosporin use.   . Vicodin [Hydrocodone-Acetaminophen] Nausea And Vomiting  Medications Prior to Admission  Medication Sig Dispense Refill Last Dose  . Prenatal Vit-Fe Fumarate-FA (MULTIVITAMIN-PRENATAL) 27-0.8 MG TABS tablet Take 1 tablet by mouth daily at 12 noon.   07/20/2020 at Unknown time  . loratadine (CLARITIN) 10 MG tablet Take 10 mg by mouth daily as needed for allergies.     Marland Kitchen ondansetron (ZOFRAN) 4 MG tablet Take 1 tablet (4 mg total) by mouth every 6 (six) hours. 12 tablet 0     Review of Systems  Constitutional: Negative for chills, diaphoresis, fatigue and fever.  Eyes: Negative for visual disturbance.   Respiratory: Negative for shortness of breath.   Cardiovascular: Negative for chest pain.  Gastrointestinal: Positive for diarrhea, nausea and vomiting. Negative for abdominal pain and constipation.  Genitourinary: Negative for dysuria, flank pain, frequency, pelvic pain, urgency, vaginal bleeding and vaginal discharge.  Neurological: Negative for dizziness, weakness, light-headedness and headaches.   Physical Exam   Blood pressure (!) 140/57, pulse (!) 59, temperature 98.4 F (36.9 C), temperature source Oral, resp. rate 18, height 5\' 8"  (1.727 m), weight 91.1 kg, last menstrual period 11/02/2019, SpO2 100 %, unknown if currently breastfeeding.  Patient Vitals for the past 24 hrs:  BP Temp Temp src Pulse Resp SpO2 Height Weight  07/21/20 1705 -- -- -- -- -- 100 % -- --  07/21/20 1701 (!) 140/57 -- -- (!) 59 -- -- -- --  07/21/20 1700 -- -- -- -- -- 100 % -- --  07/21/20 1655 -- -- -- -- -- 100 % -- --  07/21/20 1650 -- -- -- -- -- 100 % -- --  07/21/20 1646 132/67 -- -- 71 -- -- -- --  07/21/20 1645 -- -- -- -- -- 99 % -- --  07/21/20 1641 -- -- -- -- -- 99 % -- --  07/21/20 1635 -- -- -- -- -- 99 % -- --  07/21/20 1631 (!) 126/54 -- -- 65 -- 99 % -- --  07/21/20 1613 (!) 153/60 98.4 F (36.9 C) Oral (!) 58 18 100 % -- --  07/21/20 1214 -- -- -- -- -- -- 5\' 8"  (1.727 m) 91.1 kg  07/21/20 1155 (!) 143/86 98.3 F (36.8 C) Oral 63 -- 99 % -- --   Physical Exam Vitals and nursing note reviewed.  Constitutional:      General: She is not in acute distress.    Appearance: Normal appearance. She is not ill-appearing, toxic-appearing or diaphoretic.  HENT:     Head: Normocephalic and atraumatic.  Pulmonary:     Effort: Pulmonary effort is normal.  Neurological:     Mental Status: She is alert and oriented to person, place, and time.  Psychiatric:        Mood and Affect: Mood normal.        Behavior: Behavior normal.        Thought Content: Thought content normal.         Judgment: Judgment normal.    Results for orders placed or performed during the hospital encounter of 07/21/20 (from the past 24 hour(s))  Urinalysis, Routine w reflex microscopic Urine, Clean Catch     Status: Abnormal   Collection Time: 07/21/20 11:34 AM  Result Value Ref Range   Color, Urine YELLOW YELLOW   APPearance HAZY (A) CLEAR   Specific Gravity, Urine 1.027 1.005 - 1.030   pH 7.0 5.0 - 8.0   Glucose, UA NEGATIVE NEGATIVE mg/dL   Hgb urine dipstick NEGATIVE NEGATIVE   Bilirubin Urine NEGATIVE  NEGATIVE   Ketones, ur 20 (A) NEGATIVE mg/dL   Protein, ur 30 (A) NEGATIVE mg/dL   Nitrite NEGATIVE NEGATIVE   Leukocytes,Ua NEGATIVE NEGATIVE   RBC / HPF 0-5 0 - 5 RBC/hpf   WBC, UA 0-5 0 - 5 WBC/hpf   Bacteria, UA RARE (A) NONE SEEN   Squamous Epithelial / LPF 11-20 0 - 5   Mucus PRESENT   Pregnancy, urine     Status: Abnormal   Collection Time: 07/21/20 11:34 AM  Result Value Ref Range   Preg Test, Ur POSITIVE (A) NEGATIVE  CBC     Status: Abnormal   Collection Time: 07/21/20  1:02 PM  Result Value Ref Range   WBC 17.2 (H) 4.0 - 10.5 K/uL   RBC 4.46 3.87 - 5.11 MIL/uL   Hemoglobin 14.4 12.0 - 15.0 g/dL   HCT 96.041.5 36 - 46 %   MCV 93.0 80.0 - 100.0 fL   MCH 32.3 26.0 - 34.0 pg   MCHC 34.7 30.0 - 36.0 g/dL   RDW 45.411.6 09.811.5 - 11.915.5 %   Platelets 254 150 - 400 K/uL   nRBC 0.0 0.0 - 0.2 %  hCG, quantitative, pregnancy     Status: Abnormal   Collection Time: 07/21/20  1:02 PM  Result Value Ref Range   hCG, Beta Chain, Quant, S 174,982 (H) <5 mIU/mL  Wet prep, genital     Status: Abnormal   Collection Time: 07/21/20  1:02 PM  Result Value Ref Range   Yeast Wet Prep HPF POC NONE SEEN NONE SEEN   Trich, Wet Prep NONE SEEN NONE SEEN   Clue Cells Wet Prep HPF POC PRESENT (A) NONE SEEN   WBC, Wet Prep HPF POC NONE SEEN NONE SEEN   Sperm NONE SEEN   Comprehensive metabolic panel     Status: Abnormal   Collection Time: 07/21/20  1:02 PM  Result Value Ref Range   Sodium 135 135 -  145 mmol/L   Potassium 4.1 3.5 - 5.1 mmol/L   Chloride 103 98 - 111 mmol/L   CO2 20 (L) 22 - 32 mmol/L   Glucose, Bld 93 70 - 99 mg/dL   BUN 8 6 - 20 mg/dL   Creatinine, Ser 1.470.53 0.44 - 1.00 mg/dL   Calcium 9.0 8.9 - 82.910.3 mg/dL   Total Protein 6.8 6.5 - 8.1 g/dL   Albumin 3.5 3.5 - 5.0 g/dL   AST 17 15 - 41 U/L   ALT 15 0 - 44 U/L   Alkaline Phosphatase 46 38 - 126 U/L   Total Bilirubin 0.6 0.3 - 1.2 mg/dL   GFR, Estimated >56>60 >21>60 mL/min   Anion gap 12 5 - 15   US OB LESS THAN 14 WEEKS WITH OB TRANSVAGINAL  Result Date: 07/21/2020 CLINICAL DATA:  Abdominal pain, quantitative hCG = [id number] EXAM: OBSTETRIC <14 WK US AND TRANSVAGINAL OB US TECHNIQUE: Both transabdominal and transvaginal ultrasound examinations were performed for complete evaluation of the gestation as well as the maternal uterus, adnexal regions, and pelvic cul-de-sac. Transvaginal technique was performed to assess early pregnancy. COMPARISON:  Obstetrical ultrasound from a prior gestation 01/09/2020 FINDINGS: Intrauterine gestational sac: Single Yolk sac:  Visualized. Embryo:  Visualized. Cardiac Activity: Visualized. Heart Rate: 168 bpm CRL:  25.2 mm   9 w   1 d                  US EDC: 02/22/2021 Subchorionic hemorrhage:  None visualized. Maternal uterus/adnexae: Normal anteverted maternal  uterus. Ovaries are unremarkable. Right ovary measures 3.4 x 2 x 1.8 cm (6.4 mL). Left ovary measures 2.4 x 1.8 x 1.2 cm (2.6 mL). No concerning adnexal mass. No free fluid. IMPRESSION: Single intrauterine gestation at 9 weeks, 1 day by crown-rump length sonographic estimation. No acute sonographic complication. Electronically Signed   By: Kreg Shropshire M.D.   On: 07/21/2020 15:36    MAU Course  Procedures  MDM -N/V in pregnancy -weight today 91.1kg, 12.3kg weight gain since 12/2019 -UA: hazy/20ketones/30PRO/rare bacteria, sending urine for culture -CBC w/Diff: WBCs 17.2 -CMP: WNL -[place]: single IUP, [redacted]w[redacted]d -hCG: 623,762 -WetPrep:  +ClueCells (isolated finding not requiring treatment) -GC/CT collected -1L LR + 20mg  Pepcid + 4mg  Zofran given, pt reports NV now resolved -pt able to urinate after fluids -PO challenge successful -consulted with Dr. on blood pressure and starting medications, per Dr. start on labetalol 200mg  BID -pt discharged to home in stable condition   Orders Placed This Encounter  Procedures  . Wet prep, genital    Standing Status:   Standing    Number of Occurrences:   1  . Culture, OB Urine    Standing Status:   Standing    Number of Occurrences:   1  . Vergie Living OB LESS THAN 14 WEEKS WITH OB TRANSVAGINAL    Standing Status:   Standing    Number of Occurrences:   1    Order Specific Question:   Symptom/Reason for Exam    Answer:   Abdominal pain affecting pregnancy Vergie Living  . Urinalysis, Routine w reflex microscopic Urine, Clean Catch    Standing Status:   Standing    Number of Occurrences:   1  . Pregnancy, urine    Standing Status:   Standing    Number of Occurrences:   1  . CBC    Standing Status:   Standing    Number of Occurrences:   1  . hCG, quantitative, pregnancy    Standing Status:   Standing    Number of Occurrences:   1  . Comprehensive metabolic panel    Standing Status:   Standing    Number of Occurrences:   1  . TSH    Standing Status:   Standing    Number of Occurrences:   1  . Discharge patient    Order Specific Question:   Discharge disposition    Answer:   01-Home or Self Care [1]    Order Specific Question:   Discharge patient date    Answer:   07/21/2020   Meds ordered this encounter  Medications  . lactated ringers bolus 1,000 mL  . famotidine (PEPCID) IVPB 20 mg premix  . ondansetron (ZOFRAN) injection 4 mg  . famotidine (PEPCID) 20 MG tablet    Sig: Take 1 tablet (20 mg total) by mouth daily.    Dispense:  30 tablet    Refill:  1    Order Specific Question:   Supervising Provider    Answer:   [place] [[id number]]  . ondansetron  (ZOFRAN ODT) 4 MG disintegrating tablet    Sig: Take 1 tablet (4 mg total) by mouth every 8 (eight) hours as needed for nausea or vomiting.    Dispense:  20 tablet    Refill:  0    Order Specific Question:   Supervising Provider    Answer:   14/11/2019 Beaman Bing    Assessment and Plan   1. Nausea and vomiting in pregnancy  2. Abdominal pain affecting pregnancy   3. Diarrhea, unspecified type   4. [redacted] weeks gestation of pregnancy   5. Chronic hypertension affecting pregnancy     Allergies as of 07/21/2020      Reactions   Penicillins Nausea Only   Has patient had a PCN reaction causing immediate rash, facial/tongue/throat swelling, SOB or lightheadedness with hypotension: Y Has patient had a PCN reaction causing severe rash involving mucus membranes or skin necrosis: Y Has patient had a PCN reaction that required hospitalization: N Has patient had a PCN reaction occurring within the last 10 years: Y If all of the above answers are "NO", then may proceed with Cephalosporin use.   Vicodin [hydrocodone-acetaminophen] Nausea And Vomiting      Medication List    STOP taking these medications   ondansetron 4 MG tablet Commonly known as: ZOFRAN     TAKE these medications   famotidine 20 MG tablet Commonly known as: Pepcid Take 1 tablet (20 mg total) by mouth daily.   loratadine 10 MG tablet Commonly known as: CLARITIN Take 10 mg by mouth daily as needed for allergies.   multivitamin-prenatal 27-0.8 MG Tabs tablet Take 1 tablet by mouth daily at 12 noon.   ondansetron 4 MG disintegrating tablet Commonly known as: Zofran ODT Take 1 tablet (4 mg total) by mouth every 8 (eight) hours as needed for nausea or vomiting.       -will call with culture results, if positive -message sent to [facility] for BP check on Tuesday of this week for a BP check -RX Labetalol -pt advised not to restart smoking marijuana for N/V or other reason as this can worsen symptoms of N/V, information  given on cannabis hyperemesis -safe meds in pregnancy list given -pt advised to take medications around the clock and not to stop taking if feeling better -discussed nonpharmacologic and pharmacologic treatments of N/V -discussed normal expectations for N/V in pregnancy -pt discharged to home in stable condition  Joni Reining E Stephaie Dardis 07/21/2020, 5:11 PM

## 2020-07-21 NOTE — MAU Note (Signed)
Pt reports N/V started 3 weeks ago. The past week she is unable to keep food or liquid down. Diarrhea started 3 days ago. Mostly dry heaves and nausea is really bad in morning, now unable to keep meals down throughout the day. Reports feeling weak and lethargic, body is achy 6/10. And is having upper abd pain. Denies vaginal bleeding, LOF, vaginal discharge and ctx.    Wayna Chalet, RN

## 2020-07-21 NOTE — Discharge Instructions (Signed)
Prenatal Care Providers           Center for [facility] @ [facility] - accepts patients without insurance  Phone: [phone]  Center for [facility] @ [facility]   Phone: [phone]  [facility] For [facility] @[place]       Phone: [phone]            Center for [facility] @ [place]     Phone: [phone]          Center for [facility] @ [place]   Phone: [phone]  Center for [facility] @ [facility] - accepts patients without insurance  Phone: [phone]  Center for [facility] @ [facility] Phone: [phone]     [facility] - accepts patients without insurance Phone: [phone]  [facility]  Phone: [phone]  [facility] Phone: [phone]  [facility] Phone: [phone]  [facility] Phone: [phone]  [facility] Phone: [phone]  [facility]  Phone: [phone]                        Safe Medications in Pregnancy    Acne: Benzoyl Peroxide Salicylic Acid  Backache/Headache: Tylenol: 2 regular strength every 4 hours OR              2 Extra strength every 6 hours  Colds/Coughs/Allergies: Benadryl (alcohol free) 25 mg every 6 hours as needed Breath right strips Claritin Cepacol throat lozenges Chloraseptic throat spray Cold-Eeze- up to three times per day Cough drops, alcohol free Flonase (by prescription only) Guaifenesin Mucinex Robitussin DM (plain only, alcohol free) Saline nasal spray/drops Sudafed (pseudoephedrine) & Actifed ** use only after [redacted] weeks gestation and if you do not have high blood pressure Tylenol Vicks Vaporub Zinc lozenges Zyrtec   Constipation: Colace Ducolax suppositories Fleet enema Glycerin suppositories Metamucil Milk of magnesia Miralax Senokot Smooth move tea  Diarrhea: Kaopectate Imodium A-D  *NO pepto  Bismol  Hemorrhoids: Anusol Anusol HC Preparation H Tucks  Indigestion: Tums Maalox Mylanta Zantac  Pepcid  Insomnia: Benadryl (alcohol free) 25mg  every 6 hours as needed Tylenol PM Unisom, no Gelcaps  Leg Cramps: Tums MagGel  Nausea/Vomiting:  Bonine Dramamine Emetrol Ginger extract Sea bands Meclizine  Nausea medication to take during pregnancy:  Unisom (doxylamine succinate 25 mg tablets) Take one tablet daily at bedtime. If symptoms are not adequately controlled, the dose can be increased to a maximum recommended dose of two tablets daily (1/2 tablet in the morning, 1/2 tablet mid-afternoon and one at bedtime). Vitamin B6 100mg  tablets. Take one tablet twice a day (up to 200 mg per day).  Skin Rashes: Aveeno products Benadryl cream or 25mg  every 6 hours as needed Calamine Lotion 1% cortisone cream  Yeast infection: Gyne-lotrimin 7 Monistat 7   **If taking multiple medications, please check labels to avoid duplicating the same active ingredients **take medication as directed on the label ** Do not exceed 4000 mg of tylenol in 24 hours **Do not take medications that contain aspirin or ibuprofen           Morning Sickness  Morning sickness is when a woman feels nauseous during pregnancy. This nauseous feeling may or may not come with vomiting. It often occurs in the morning, but it can be a problem at any time of day. Morning sickness is most common during the first trimester. In some cases, it may continue throughout pregnancy. Although morning sickness is  unpleasant, it is usually harmless unless the woman develops severe and continual vomiting (hyperemesis gravidarum), a condition that requires more intense treatment. What are the causes? The exact cause of this condition is not known, but it seems to be related to normal hormonal changes that occur in pregnancy. What increases the risk? You are more likely to develop this condition if:  You  experienced nausea or vomiting before your pregnancy.  You had morning sickness during a previous pregnancy.  You are pregnant with more than one baby, such as twins. What are the signs or symptoms? Symptoms of this condition include:  Nausea.  Vomiting. How is this diagnosed? This condition is usually diagnosed based on your signs and symptoms. How is this treated? In many cases, treatment is not needed for this condition. Making some changes to what you eat may help to control symptoms. Your health care provider may also prescribe or recommend:  Vitamin B6 supplements.  Anti-nausea medicines.  Ginger. Follow these instructions at home: Medicines  Take over-the-counter and prescription medicines only as told by your health care provider. Do not use any prescription, over-the-counter, or herbal medicines for morning sickness without first talking with your health care provider.  Taking multivitamins before getting pregnant can prevent or decrease the severity of morning sickness in most women. Eating and drinking  Eat a piece of dry toast or crackers before getting out of bed in the morning.  Eat 5 or 6 small meals a day.  Eat dry and bland foods, such as rice or a baked potato. Foods that are high in carbohydrates are often helpful.  Avoid greasy, fatty, and spicy foods.  Have someone cook for you if the smell of any food causes nausea and vomiting.  If you feel nauseous after taking prenatal vitamins, take the vitamins at night or with a snack.  Snack on protein foods between meals if you are hungry. Nuts, yogurt, and cheese are good options.  Drink fluids throughout the day.  Try ginger ale made with real ginger, ginger tea made from fresh grated ginger, or ginger candies. General instructions  Do not use any products that contain nicotine or tobacco, such as cigarettes and e-cigarettes. If you need help quitting, ask your health care provider.  Get an air  purifier to keep the air in your house free of odors.  Get plenty of fresh air.  Try to avoid odors that trigger your nausea.  Consider trying these methods to help relieve symptoms: ? Wearing an acupressure wristband. These wristbands are often worn for seasickness. ? Acupuncture. Contact a health care provider if:  Your home remedies are not working and you need medicine.  You feel dizzy or light-headed.  You are losing weight. Get help right away if:  You have persistent and uncontrolled nausea and vomiting.  You faint.  You have severe pain in your abdomen. Summary  Morning sickness is when a woman feels nauseous during pregnancy. This nauseous feeling may or may not come with vomiting.  Morning sickness is most common during the first trimester.  It often occurs in the morning, but it can be a problem at any time of day.  In many cases, treatment is not needed for this condition. Making some changes to what you eat may help to control symptoms. This information is not intended to replace advice given to you by your health care provider. Make sure you discuss any questions you have with your health care provider. Document Revised: 07/17/2017 Document  Reviewed: 09/06/2016 Elsevier Patient Education  2020 Elsevier Inc.        Hyperemesis Gravidarum Hyperemesis gravidarum is a severe form of nausea and vomiting that happens during pregnancy. Hyperemesis is worse than morning sickness. It may cause you to have nausea or vomiting all day for many days. It may keep you from eating and drinking enough food and liquids, which can lead to dehydration, malnutrition, and weight loss. Hyperemesis usually occurs during the first half (the first 20 weeks) of pregnancy. It often goes away once a woman is in her second half of pregnancy. However, sometimes hyperemesis continues through an entire pregnancy. What are the causes? The cause of this condition is not known. It may be  related to changes in chemicals (hormones) in the body during pregnancy, such as the high level of pregnancy hormone (human chorionic gonadotropin) or the increase in the female sex hormone (estrogen). What are the signs or symptoms? Symptoms of this condition include:  Nausea that does not go away.  Vomiting that does not allow you to keep any food down.  Weight loss.  Body fluid loss (dehydration).  Having no desire to eat, or not liking food that you have previously enjoyed. How is this diagnosed? This condition may be diagnosed based on:  A physical exam.  Your medical history.  Your symptoms.  Blood tests.  Urine tests. How is this treated? This condition is managed by controlling symptoms. This may include:  Following an eating plan. This can help lessen nausea and vomiting.  Taking prescription medicines. An eating plan and medicines are often used together to help control symptoms. If medicines do not help relieve nausea and vomiting, you may need to receive fluids through an IV at the hospital. Follow these instructions at home: Eating and drinking   Avoid the following: ? Drinking fluids with meals. Try not to drink anything during the 30 minutes before and after your meals. ? Drinking more than 1 cup of fluid at a time. ? Eating foods that trigger your symptoms. These may include spicy foods, coffee, high-fat foods, very sweet foods, and acidic foods. ? Skipping meals. Nausea can be more intense on an empty stomach. If you cannot tolerate food, do not force it. Try sucking on ice chips or other frozen items and make up for missed calories later. ? Lying down within 2 hours after eating. ? Being exposed to environmental triggers. These may include food smells, smoky rooms, closed spaces, rooms with strong smells, warm or humid places, overly loud and noisy rooms, and rooms with motion or flickering lights. Try eating meals in a well-ventilated area that is free of  strong smells. ? Quick and sudden changes in your movement. ? Taking iron pills and multivitamins that contain iron. If you take prescription iron pills, do not stop taking them unless your health care provider approves. ? Preparing food. The smell of food can spoil your appetite or trigger nausea.  To help relieve your symptoms: ? Listen to your body. Everyone is different and has different preferences. Find what works best for you. ? Eat and drink slowly. ? Eat 5-6 small meals daily instead of 3 large meals. Eating small meals and snacks can help you avoid an empty stomach. ? In the morning, before getting out of bed, eat a couple of crackers to avoid moving around on an empty stomach. ? Try eating starchy foods as these are usually tolerated well. Examples include cereal, toast, bread, potatoes, pasta, rice, and  pretzels. ? Include at least 1 serving of protein with your meals and snacks. Protein options include lean meats, poultry, seafood, beans, nuts, nut butters, eggs, cheese, and yogurt. ? Try eating a protein-rich snack before bed. Examples of a protein-rick snack include cheese and crackers or a peanut butter sandwich made with 1 slice of whole-wheat bread and 1 tsp (5 g) of peanut butter. ? Eat or suck on things that have ginger in them. It may help relieve nausea. Add  tsp ground ginger to hot tea or choose ginger tea. ? Try drinking 100% fruit juice or an electrolyte drink. An electrolyte drink contains sodium, potassium, and chloride. ? Drink fluids that are cold, clear, and carbonated or sour. Examples include lemonade, ginger ale, lemon-lime soda, ice water, and sparkling water. ? Brush your teeth or use a mouth rinse after meals. ? Talk with your health care provider about starting a supplement of vitamin B6. General instructions  Take over-the-counter and prescription medicines only as told by your health care provider.  Follow instructions from your health care provider  about eating or drinking restrictions.  Continue to take your prenatal vitamins as told by your health care provider. If you are having trouble taking your prenatal vitamins, talk with your health care provider about different options.  Keep all follow-up and pre-birth (prenatal) visits as told by your health care provider. This is important. Contact a health care provider if:  You have pain in your abdomen.  You have a severe headache.  You have vision problems.  You are losing weight.  You feel weak or dizzy. Get help right away if:  You cannot drink fluids without vomiting.  You vomit blood.  You have constant nausea and vomiting.  You are very weak.  You faint.  You have a fever and your symptoms suddenly get worse. Summary  Hyperemesis gravidarum is a severe form of nausea and vomiting that happens during pregnancy.  Making some changes to your eating habits may help relieve nausea and vomiting.  This condition may be managed with medicine.  If medicines do not help relieve nausea and vomiting, you may need to receive fluids through an IV at the hospital. This information is not intended to replace advice given to you by your health care provider. Make sure you discuss any questions you have with your health care provider. Document Revised: 08/24/2017 Document Reviewed: 04/02/2016 Elsevier Patient Education  2020 Elsevier Inc.        Cannabinoid Hyperemesis Syndrome Cannabinoid hyperemesis syndrome (CHS) is a condition that causes repeated nausea, vomiting, and abdominal pain after long-term (chronic) use of marijuana (cannabis). People with CHS typically use marijuana 3-5 times a day for many years before they have symptoms, although it is possible to develop CHS with as little as 1 use per day. Symptoms of CHS may be mild at first but can get worse and more frequent. In some cases, CHS may cause vomiting many times a day, which can lead to weight loss and  dehydration. CHS may go away and come back many times (recur). People may not have symptoms or may otherwise be healthy in between [place] attacks. What are the causes? The exact cause of this condition is not known. Long-term use of marijuana may over-stimulate certain proteins in the brain that react with chemicals in marijuana (cannabinoid receptors). This over-stimulation may cause CHS. What are the signs or symptoms? Symptoms of this condition are often mild during the first few attacks, but they can  get worse over time. Symptoms may include:  Frequent nausea, especially early in the morning.  Vomiting.  Abdominal pain. Taking several hot showers throughout the day can also be a sign of this condition. People with CHS may do this because it relieves symptoms. How is this diagnosed? This condition may be diagnosed based on:  Your symptoms and medical history, including any drug use.  A physical exam. You may have tests done to rule out other problems. These tests may include:  Blood tests.  Urine tests.  Imaging tests, such as an X-ray or CT scan. How is this treated? Treatment for this condition involves stopping marijuana use. Your health care provider may recommend:  A drug rehabilitation program, if you have trouble stopping marijuana use.  Medicines for nausea.  Hot showers to help relieve symptoms. Certain creams that contain a substance called capsaicin may improve symptoms when applied to the abdomen. Ask your health care provider before starting any medicines or other treatments. Severe nausea and vomiting may require you to stay at the hospital. You may need IV fluids to prevent or treat dehydration. You may also need certain medicines that must be given at the hospital. Follow these instructions at home: During an attack   Stay in bed and rest in a dark, quiet room.  Take anti-nausea medicine as told by your health care provider.  Try taking hot showers to  relieve your symptoms. After an attack  Drink small amounts of clear fluids slowly. Gradually add more.  Once you are able to eat without vomiting, eat soft foods in small amounts every 3-4 hours. General instructions   Do not use any products that contain marijuana.If you need help quitting, ask your health care provider for resources and treatment options.  Drink enough fluid to keep your urine pale yellow. Avoid drinking fluids that have a lot of sugar or caffeine, such as coffee and soda.  Take and apply over-the-counter and prescription medicines only as told by your health care provider. Ask your health care provider before starting any new medicines or treatments.  Keep all follow-up visits as told by your health care provider. This is important. Contact a health care provider if:  Your symptoms get worse.  You cannot drink fluids without vomiting.  You have pain and trouble swallowing after an attack. Get help right away if:  You cannot stop vomiting.  You have blood in your vomit or your vomit looks like coffee grounds.  You have severe abdominal pain.  You have stools that are bloody or black, or stools that look like tar.  You have symptoms of dehydration, such as: ? Sunken eyes. ? Inability to make tears. ? Cracked lips. ? Dry mouth. ? Decreased urine production. ? Weakness. ? Sleepiness. ? Fainting. Summary  Cannabinoid hyperemesis syndrome (CHS) is a condition that causes repeated nausea, vomiting, and abdominal pain after long-term use of marijuana.  People with CHS typically use marijuana 3-5 times a day for many years before they have symptoms, although it is possible to develop CHS with as little as 1 use per day.  Treatment for this condition involves stopping marijuana use. Hot showers and capsaicin creams may also help relieve symptoms. Ask your health care provider before starting any medicines or other treatments.  Your health care provider may  prescribe medicines to help with nausea.  Get help right away if you have signs of dehydration, such as dry mouth, decreased urine production, or weakness. This information is not  intended to replace advice given to you by your health care provider. Make sure you discuss any questions you have with your health care provider. Document Revised: 12/11/2017 Document Reviewed: 11/12/2016 Elsevier Patient Education  2020 [employer]        Marijuana Use During Pregnancy and Breastfeeding  Marijuana is the dried leaves, flowers, and stems of the Cannabis sativa or Cannabis indica plant. The plant's active ingredients (cannabinoids), including a chemical called THC, change the chemistry of the brain. Marijuana smoke also has many of the same chemicals as cigarette smoke that cause breathing problems. Marijuana gets into your blood through your lungs when you smoke it and through your digestive system when you swallow it. Using marijuana in any form may be harmful for you and your baby when you are trying to become pregnant and during pregnancy. This includes marijuana that is prescribed to you by a health care provider (medical marijuana). Once marijuana is in your blood, it can travel through your placenta to your baby. It may also pass through breast milk. How does this affect me? Marijuana affects you both mentally and physically. Using marijuana can make you feel high and relaxed. It can also have negative effects, especially at high doses or with long-term use. These include: Rapid heartbeat and stress on your heart. Lung irritation and breathing problems. Difficulty thinking and making decisions. Seeing or believing things that are not true (hallucinations and paranoia). Mood swings, depression, or anxiety. Decreased ability to learn and remember. Difficulty getting pregnant. Marijuana can also affect your pregnancy. Not all the effects are known. However, if you use marijuana during  pregnancy, you may: Be less likely to get regular prenatal care and do the things that you need to do to have a healthy pregnancy. Be more likely to use other drugs that can harm your pregnancy, like drinking alcohol and smoking cigarettes. Be at higher risk of having your baby die after 28 weeks of pregnancy (stillbirth). Be at higher risk of giving birth before 37 weeks of pregnancy (premature birth). How does this affect my baby? If you use marijuana during pregnancy, this may affect your baby's development, birth, and life after birth. Your baby may: Be born prematurely, which can cause physical and mental problems. Be born with a low birth weight, which can lead to physical and mental problems. Have problems with brain development. Have difficulty growing. Have attention and behavior problems later in life. Do poorly at school and have learning problems later in life. Have problems with vision and coordination. Be at higher risk for using marijuana by age 214. More research is needed to find out exactly how marijuana affects a baby during breastfeeding. Some studies suggest that the chemicals in marijuana can be passed to a baby through breast milk. To limit possible risks, you should not use marijuana during breastfeeding. Follow these instructions at home: Let your health care provider know if you use marijuana before trying to get pregnant, during pregnancy, or during breastfeeding. Do not use marijuana in any form when you are trying to get pregnant, when you are pregnant, or when you are breastfeeding. If you are having trouble stopping marijuana use, ask your health care provider for help. Do not smoke. If you need help quitting, ask your health care provider for help. If you are using medical marijuana, ask your health care provider to switch you to a medicine that is safer to use during pregnancy or breastfeeding. Keep all your prenatal visits as told by  your health care provider.  This is important. Where to find more information [employer] on Drug Abuse: www.drugabuse.gov March of Dimes: www.marchofdimes.org/pregnancy Contact a health care provider if: You use marijuana and want to get pregnant. You use marijuana during pregnancy or breastfeeding. You need help stopping marijuana use. Get help right away if: Your baby is not gaining weight or growing as expected. Summary Using marijuana in any form may be harmful for you and your baby when you are trying to become pregnant, during pregnancy, and during breastfeeding. This includes marijuana that is prescribed to you (medical marijuana). Some studies suggest that marijuana may pass through breast milk and can affect your baby's brain development. Talk to your health care provider if you use marijuana in any form while trying to get pregnant, during pregnancy, or while breastfeeding. Ask your health care provider for help if you are not able to stop using marijuana. This information is not intended to replace advice given to you by your health care provider. Make sure you discuss any questions you have with your health care provider. Document Revised: 11/26/2018 Document Reviewed: 04/22/2017 Elsevier Patient Education  2020 [employer].

## 2020-07-22 LAB — CULTURE, OB URINE

## 2020-07-23 LAB — GC/CHLAMYDIA PROBE AMP (~~LOC~~) NOT AT ARMC
Chlamydia: NEGATIVE
Comment: NEGATIVE
Comment: NORMAL
Neisseria Gonorrhea: NEGATIVE

## 2020-07-24 ENCOUNTER — Ambulatory Visit (INDEPENDENT_AMBULATORY_CARE_PROVIDER_SITE_OTHER): Payer: Self-pay | Admitting: General Practice

## 2020-07-24 ENCOUNTER — Other Ambulatory Visit: Payer: Self-pay

## 2020-07-24 VITALS — BP 121/68 | HR 51 | Ht 68.0 in | Wt 202.0 lb

## 2020-07-24 DIAGNOSIS — Z0130 Encounter for examination of blood pressure without abnormal findings: Secondary | ICD-10-CM

## 2020-07-24 NOTE — Progress Notes (Addendum)
Patient presents to office for BP check today following up from MAU visit on 12/4. Per MAU note, patient was given Rx for Labetalol 200mg  BID and told to return to office for BP check a couple days later. Patient states she did not receive a Rx for Labetalol only Prilosec & Zofran. Patient states both meds are helping and she has been taking Zofran every 4 hours like the pharmacist told her. Reviewed with patient that Zofran is only every 8 hours as needed. Patient verbalized understanding. She denies headaches with dizziness/blurry vision but reports history of headaches. She has been feeling tired, out of energy, and "doesn't feel like doing anything". Discussed with patient that can be common at this point in pregnancy especially since she has been so sick for many days. Told patient hopefully that will improve since she has medication now & continue to improve over the next couple of weeks. Patient had several questions regarding mychart results. Reviewed OB urine culture, UA, ultrasound, & wet prep results with patient. BP 121/68 today. Patient has not started care yet and is working on . Gave BP cuff to patient and reviewed use. Advised she contact Personal assistant if BP is elevated and for now we will monitor rather than start medication. Patient verbalized understanding & will schedule appt for OB care.   [place] RN BSN 07/24/20   Chart reviewed for nurse visit. Agree with plan of care.   14/07/21, NP 07/24/2020 11:57 AM

## 2020-07-25 ENCOUNTER — Other Ambulatory Visit: Payer: Self-pay | Admitting: Lactation Services

## 2020-07-25 MED ORDER — ONDANSETRON HCL 4 MG PO TABS: 4.0000 mg | ORAL_TABLET | Freq: Three times a day (TID) | ORAL | 1 refills | Status: DC | PRN

## 2020-07-25 NOTE — Progress Notes (Signed)
Ordered Zofran at patients request with approval from Donia Ast, NP

## 2020-08-01 ENCOUNTER — Other Ambulatory Visit: Payer: Self-pay

## 2020-08-01 ENCOUNTER — Telehealth (INDEPENDENT_AMBULATORY_CARE_PROVIDER_SITE_OTHER): Payer: Self-pay | Admitting: *Deleted

## 2020-08-01 DIAGNOSIS — Z3A00 Weeks of gestation of pregnancy not specified: Secondary | ICD-10-CM

## 2020-08-01 DIAGNOSIS — O26619 Liver and biliary tract disorders in pregnancy, unspecified trimester: Secondary | ICD-10-CM

## 2020-08-01 DIAGNOSIS — O0990 Supervision of high risk pregnancy, unspecified, unspecified trimester: Secondary | ICD-10-CM

## 2020-08-01 DIAGNOSIS — O169 Unspecified maternal hypertension, unspecified trimester: Secondary | ICD-10-CM

## 2020-08-01 DIAGNOSIS — K760 Fatty (change of) liver, not elsewhere classified: Secondary | ICD-10-CM

## 2020-08-01 NOTE — Patient Instructions (Addendum)
- At our [facility] OB/GYN Practices, we work as an integrated team, providing care to address both physical and emotional health. Your medical provider may refer you to see our Behavioral Health Clinician [facility]) on the same day you see your medical provider, as availability permits; often scheduled virtually at your convenience.  Our [facility] is available to all patients, visits generally last between 20-30 minutes, but can be longer or shorter, depending on patient need. The [facility] offers help with stress management, coping with symptoms of depression and anxiety, major life changes , sleep issues, changing risky behavior, grief and loss, life stress, working on personal life goals, and  behavioral health issues, as these all affect your overall health and wellness.  The [facility] is NOT available for the following: FMLA paperwork, court-ordered evaluations, specialty assessments (custody or disability), letters to employers, or obtaining certification for an emotional support animal. The [facility] does not provide long-term therapy. You have the right to refuse integrated behavioral health services, or to reschedule to see the [facility] at a later date.  Confidentiality exception: If it is suspected that a child or disabled adult is being abused or neglected, we are required by law to report that to either Child Protective Services or Adult Management consultant.  If you have a diagnosis of Bipolar affective disorder, Schizophrenia, or recurrent Major depressive disorder, we will recommend that you establish care with a psychiatrist, as these are lifelong, chronic conditions, and we want your overall emotional health and medications to be more closely monitored. If you anticipate needing extended maternity leave due to mental health issues postpartum, it it recommended you inform your medical provider, so we can put in a referral to a  psychiatrist as soon as possible. The [facility] is unable to recommend an extended maternity leave for mental  health issues. Your medical provider or [facility] may refer you to a therapist for ongoing, traditional therapy, or to a psychiatrist, for medication management, if it would benefit your overall health. Depending on your insurance, you may have a copay to see the [facility]. If you are uninsured, it is recommended that you apply for financial assistance. (Forms may be requested at the front desk for in-person visits, via MyChart, or request a form during a virtual visit).  If you see the [facility] more than 6 times, you will have to complete a comprehensive clinical assessment interview with the [facility] to resume integrated services.  For virtual visits with the [facility], you must be physically in the state of [place] at the time of the visit. For example, if you live in [place], you will have to do an in-person visit with the [facility], and your out-of-state insurance may not cover behavioral health services in [place]. f you are going out of the state or country for any reason, the [facility] may see you virtually when you return to [place], but not while you are physically outside of [place].    Meet the Provider Zoom Sessions      [facility] for [facility] is now offering FREE monthly 1-hour virtual Zoom sessions for new, current, and prospective patients.        During these sessions, you can:   Learn about our practice, model of care, services   Get answers to questions about pregnancy and birth during COVID   Pick your providers brain about anything else!    Sessions will be hosted by [employer] for [employer], Producer, television/film/video, Physicians and Midwives  No registration required      2021 Dates:      All at 6pm     October 21st     November 18th   December 16th     January 20th  February 17th    To join one of these meetings, a few minutes before it is set to start:     Copy/paste the link into your web  browser:  [email or web address]    OR  Scan the QR code below (open up your camera and point towards QR code; click on tab that pops up on your phone ("zoom")

## 2020-08-01 NOTE — Progress Notes (Signed)
New OB Intake  I connected with  Sara Frost on 08/01/20 at  9:15 AM EST by telephone and verified that I am speaking with the correct person using two identifiers. She was unable to connect virtually.  Nurse is located at [facility] and pt is located at home.  I discussed the limitations, risks, security and privacy concerns of performing an evaluation and management service by telephone and the availability of in person appointments. I also discussed with the patient that there may be a patient responsible charge related to this service. The patient expressed understanding and agreed to proceed.  I explained I am completing New OB Intake today. We discussed her EDD of 02/22/2021 that is based on [place] per provider at MAU visit  Per protocol due to difference in dating from LMP.  Pt is G3/P0020. I reviewed her allergies, medications, Medical/Surgical/OB history, and appropriate screenings. I informed her of [facility] services. Based on history, this is a/an complicated by Fatty liver. pregnancy. She also was told to take labetolol early in pregnancy but did not receive the prescription and then had a nurse visit and told BP wnl , no need to start the medication. I encouraged her if she checks her blood pressure or is checked at another office and is elevated before her new ob visit with [place] - to call our office for an appointment for another nurse visit.   Concerns addressed today  Delivery plans: Plans to deliver at [facility].   MyChart/Babyscripts MyChart access verified. I explained pt will have some visits in office and some virtually. Babyscripts instructions given.   Blood Pressure Cuff Patient has a blood pressure cuff and states knows how to use it.  Explained after first prenatal appt pt will check weekly and document in Babyscripts.  Anatomy US Explained first scheduled [place] will be around 19 weeks.Explained will schedule Anatomy US scheduled for 09/28/2020 at 0815. And she will be notified by  [facility].  She informed me she is having an Ultrasound at [facility]. I asked her to sign a release so we could have that report.   Labs Discussed Avelina Laine genetic screening with patient. She is unsure if she would like them drawn.  Routine prenatal labs needed.  WIC Offered [facility] referral which she declined.   New Patient meeting Informed of New patient meeting. Information placed in AVS.   COVID Vaccine Has not had Covid vaccine and does not want information.    First visit review I reviewed new OB appt with pt. I explained she will have a pelvic exam, ob bloodwork with genetic screening If desired, and PAP smear. Explained pt will be seen by Dr. Debroah Loop at first visit; encounter routed to appropriate provider.  Whit Bruni,RN 08/01/2020  9:29 AM

## 2020-08-13 ENCOUNTER — Encounter: Payer: Self-pay | Admitting: Obstetrics & Gynecology

## 2020-08-16 ENCOUNTER — Telehealth: Payer: Self-pay | Admitting: Family Medicine

## 2020-08-16 NOTE — Telephone Encounter (Signed)
Attempted to reach patient about her missed appointment. Left a voicemail message for her to call us back to reschedule her missed OB appointment.

## 2020-09-28 ENCOUNTER — Ambulatory Visit: Payer: Self-pay

## 2020-09-28 ENCOUNTER — Ambulatory Visit: Payer: Self-pay | Attending: Obstetrics & Gynecology

## 2020-10-25 ENCOUNTER — Ambulatory Visit (INDEPENDENT_AMBULATORY_CARE_PROVIDER_SITE_OTHER): Payer: Self-pay | Admitting: Family Medicine

## 2020-10-25 ENCOUNTER — Other Ambulatory Visit: Payer: Self-pay

## 2020-10-25 ENCOUNTER — Encounter: Payer: Self-pay | Admitting: Family Medicine

## 2020-10-25 VITALS — BP 142/83 | HR 75 | Temp 97.6°F | Ht 68.0 in | Wt 212.0 lb

## 2020-10-25 DIAGNOSIS — R21 Rash and other nonspecific skin eruption: Secondary | ICD-10-CM

## 2020-10-25 MED ORDER — VALACYCLOVIR HCL 1 G PO TABS
1000.0000 mg | ORAL_TABLET | Freq: Three times a day (TID) | ORAL | 0 refills | Status: AC
Start: 2020-10-25 — End: 2020-11-01

## 2020-10-25 MED ORDER — PREDNISONE 20 MG PO TABS
ORAL_TABLET | ORAL | 0 refills | Status: AC
Start: 2020-10-25 — End: 2020-10-30

## 2020-10-25 MED ORDER — DOXYCYCLINE HYCLATE 100 MG PO CAPS: 100.0000 mg | ORAL_CAPSULE | Freq: Two times a day (BID) | ORAL | 0 refills | Status: DC

## 2020-10-25 MED ORDER — FLUTICASONE PROPIONATE 0.05 % EX CREA: TOPICAL_CREAM | Freq: Two times a day (BID) | CUTANEOUS | 0 refills | Status: AC

## 2020-10-25 NOTE — Patient Instructions (Addendum)
Rash, Adult  A rash is a change in the color of your skin. A rash can also change the way your skin feels. There are many different conditions and factors that can cause a rash. Follow these instructions at home: The goal of treatment is to stop the itching and keep the rash from spreading. Watch for any changes in your symptoms. Let your doctor know about them. Follow these instructions to help with your condition: Medicine Take or apply over-the-counter and prescription medicines only as told by your doctor. These may include medicines:  To treat red or swollen skin (corticosteroid creams).  To treat itching.  To treat an allergy (oral antihistamines).  To treat very bad symptoms (oral corticosteroids).   Skin care  Put cool cloths (compresses) on the affected areas.  Do not scratch or rub your skin.  Avoid covering the rash. Make sure that the rash is exposed to air as much as possible. Managing itching and discomfort  Avoid hot showers or baths. These can make itching worse. A cold shower may help.  Try taking a bath with: ? Epsom salts. You can get these at your local pharmacy or grocery store. Follow the instructions on the package. ? Baking soda. Pour a small amount into the bath as told by your doctor. ? Colloidal oatmeal. You can get this at your local pharmacy or grocery store. Follow the instructions on the package.  Try putting baking soda paste onto your skin. Stir water into baking soda until it gets like a paste.  Try putting on a lotion that relieves itchiness (calamine lotion).  Keep cool and out of the sun. Sweating and being hot can make itching worse. General instructions  Rest as needed.  Drink enough fluid to keep your pee (urine) pale yellow.  Wear loose-fitting clothing.  Avoid scented soaps, detergents, and perfumes. Use gentle soaps, detergents, perfumes, and other cosmetic products.  Avoid anything that causes your rash. Keep a journal  to help track what causes your rash. Write down: ? What you eat. ? What cosmetic products you use. ? What you drink. ? What you wear. This includes jewelry.  Keep all follow-up visits as told by your doctor. This is important.   Contact a doctor if:  You sweat at night.  You lose weight.  You pee (urinate) more than normal.  You pee less than normal, or you notice that your pee is a darker color than normal.  You feel weak.  You throw up (vomit).  Your skin or the whites of your eyes look yellow (jaundice).  Your skin: ? Tingles. ? Is numb.  Your rash: ? Does not go away after a few days. ? Gets worse.  You are: ? More thirsty than normal. ? More tired than normal.  You have: ? New symptoms. ? Pain in your belly (abdomen). ? A fever. ? Watery poop (diarrhea). Get help right away if:  You have a fever and your symptoms suddenly get worse.  You start to feel mixed up (confused).  You have a very bad headache or a stiff neck.  You have very bad joint pains or stiffness.  You have jerky movements that you cannot control (seizure).  Your rash covers all or most of your body. The rash may or may not be painful.  You have blisters that: ? Are on top of the rash. ? Grow larger. ? Grow together. ? Are painful. ? Are inside your nose or mouth.  You have a rash that: ? Looks like purple pinprick-sized spots all over your body. ? Has a "bull's eye" or looks like a target. ? Is red and painful, causes your skin to peel, and is not from being in the sun too long. Summary  A rash is a change in the color of your skin. A rash can also change the way your skin feels.  The goal of treatment is to stop the itching and keep the rash from spreading.  Take or apply over-the-counter and prescription medicines only as told by your doctor.  Contact a doctor if you have new symptoms or symptoms that get worse.  Keep all follow-up visits as told by your doctor. This is  important. This information is not intended to replace advice given to you by your health care provider. Make sure you discuss any questions you have with your health care provider. Document Revised: 11/26/2018 Document Reviewed: 03/08/2018 Elsevier Patient Education  2021 [employer].  If you have lab work done today you will be contacted with your lab results within the next 2 weeks.  If you have not heard from [place] then please contact us. The fastest way to get your results is to register for My Chart.   IF you received an x-ray today, you will receive an invoice from [facility]. Please contact [facility] at [phone] with questions or concerns regarding your invoice.   IF you received labwork today, you will receive an invoice from [facility]. Please contact [facility] at [phone] with questions or concerns regarding your invoice.   Our billing staff will not be able to assist you with questions regarding bills from these companies.  You will be contacted with the lab results as soon as they are available. The fastest way to get your results is to activate your My Chart account. Instructions are located on the last page of this paperwork. If you have not heard from [place] regarding the results in 2 weeks, please contact this office.

## 2020-10-25 NOTE — Progress Notes (Signed)
3/10/202212:11 PM  Sara Frost July 29, 1991, 30 y.o., female [id number]  Chief Complaint  Patient presents with  . Rash    All over body for about two weeks     HPI:   Patient is a 30 y.o. female with past medical history significant for recent miscarriage who presents today for rash.  Had a recent miscarriage in January Started breaking out on her face then spread to her chest Then spread to her back  The rash on her back is itchy now The rash by her butt has serous drainage This started 2 weeks ago Originally thought this was acne     Depression screen [facility] 2/9 10/25/2020 08/01/2020  Decreased Interest 0 3  Down, Depressed, Hopeless 0 0  PHQ - 2 Score 0 3  Altered sleeping - 1  Tired, decreased energy - 2  Change in appetite - 3  Feeling bad or failure about yourself  - 0  Trouble concentrating - 0  Moving slowly or fidgety/restless - 0  Suicidal thoughts - 0  PHQ-9 Score - 9    No flowsheet data found.   Allergies  Allergen Reactions  . Penicillins Nausea Only    Has patient had a PCN reaction causing immediate rash, facial/tongue/throat swelling, SOB or lightheadedness with hypotension: Y Has patient had a PCN reaction causing severe rash involving mucus membranes or skin necrosis: Y Has patient had a PCN reaction that required hospitalization: N Has patient had a PCN reaction occurring within the last 10 years: Y If all of the above answers are "NO", then may proceed with Cephalosporin use.   . Vicodin [Hydrocodone-Acetaminophen] Nausea And Vomiting    Prior to Admission medications   Medication Sig Start Date End Date Taking? Authorizing Provider  famotidine (PEPCID) 20 MG tablet Take 1 tablet (20 mg total) by mouth daily. 07/21/20 07/21/21  Nugent, Gerrie Nordmann, NP  loratadine (CLARITIN) 10 MG tablet Take 10 mg by mouth daily as needed for allergies.    [provider]  ondansetron (ZOFRAN) 4 MG tablet Take 1 tablet (4 mg total) by mouth every 8  (eight) hours as needed for nausea or vomiting. 07/25/20   Nugent, Gerrie Nordmann, NP  Prenatal Vit-Fe Fumarate-FA (MULTIVITAMIN-PRENATAL) 27-0.8 MG TABS tablet Take 1 tablet by mouth daily at 12 noon.    [provider]    Past Medical History:  Diagnosis Date  . Fatty liver     Past Surgical History:  Procedure Laterality Date  . BONE EXOSTOSIS EXCISION Left 09/08/2019   Procedure: LEFT WRIST EXOSTOSIS EXCISION;  Surgeon: Leanora Cover, MD;  Location: [facility];  Service: Orthopedics;  Laterality: Left;  . CLAVICLE SURGERY    . OPEN REDUCTION INTERNAL FIXATION (ORIF) DISTAL RADIAL FRACTURE Left 02/24/2019   Procedure: OPEN REDUCTION INTERNAL FIXATION (ORIF) LEFT DISTAL RADIAL FRACTURE;  Surgeon: Leanora Cover, MD;  Location: [place];  Service: Orthopedics;  Laterality: Left;  . WRIST ARTHROSCOPY  09/2019    Social History   Tobacco Use  . Smoking status: Never Smoker  . Smokeless tobacco: Never Used  Substance Use Topics  . Alcohol use: Never    Family History  Problem Relation Age of Onset  . Lung cancer Mother   . Liver cancer Maternal Grandmother   . Pancreatic cancer Paternal Grandfather     Review of Systems  Constitutional: Negative for chills, fever and malaise/fatigue.  Eyes: Negative for blurred vision and double vision.  Respiratory: Negative for cough, shortness of  breath and wheezing.   Cardiovascular: Negative for chest pain, palpitations and leg swelling.  Gastrointestinal: Negative for abdominal pain, blood in stool, constipation, diarrhea, heartburn, nausea and vomiting.  Genitourinary: Negative for dysuria, frequency and hematuria.  Musculoskeletal: Negative for back pain, joint pain and myalgias.  Skin: Positive for itching and rash.  Neurological: Negative for dizziness, tingling, sensory change, speech change, focal weakness, weakness and headaches.     OBJECTIVE:  Today's Vitals   10/25/20 1105  BP: (!) 142/83   Pulse: 75  Temp: 97.6 F (36.4 C)  SpO2: 96%  Weight: 212 lb (96.2 kg)  Height: 5' 8"  (1.727 m)   Body mass index is 32.23 kg/m.   Physical Exam Constitutional:      General: She is not in acute distress.    Appearance: Normal appearance. She is not ill-appearing.  HENT:     Head: Normocephalic.  Cardiovascular:     Rate and Rhythm: Normal rate and regular rhythm.     Pulses: Normal pulses.     Heart sounds: Normal heart sounds. No murmur heard. No friction rub. No gallop.   Pulmonary:     Effort: Pulmonary effort is normal. No respiratory distress.     Breath sounds: Normal breath sounds. No stridor. No wheezing, rhonchi or rales.  Abdominal:     General: Bowel sounds are normal.     Palpations: Abdomen is soft.     Tenderness: There is no abdominal tenderness.  Musculoskeletal:     Right lower leg: No edema.     Left lower leg: No edema.  Skin:    General: Skin is warm and dry.     Findings: Rash present. Rash is crusting (see images x3 in chart).       Neurological:     Mental Status: She is alert and oriented to person, place, and time.  Psychiatric:        Mood and Affect: Mood normal.        Behavior: Behavior normal.           No results found for this or any previous visit (from the past 24 hour(s)).  No results found.   ASSESSMENT and PLAN  Problem List Items Addressed This Visit   None   Visit Diagnoses    Rash and nonspecific skin eruption    -  Primary   Relevant Medications   doxycycline (VIBRAMYCIN) 100 MG capsule   predniSONE (DELTASONE) 20 MG tablet   fluticasone (CUTIVATE) 0.05 % cream   valACYclovir (VALTREX) 1000 MG tablet   Other Relevant Orders   Ambulatory referral to Dermatology   WOUND CULTURE   CBC with Differential   CMP14+EGFR     Plan . Valacyclovir tid for 7 days . Doxycycline bid for 14 days . Prednisone taper and cream . Educated on skin care, stop all lotions and only use non-scented soaps . Will follow  up with lab results . RTC/ED precautions provided   Return if symptoms worsen or fail to improve.    Huston Foley Jacob Chamblee, FNP-BC Primary Care at [facility] [place], [place] [address] Ph.  [phone] Fax [phone]

## 2020-10-26 ENCOUNTER — Encounter: Payer: Self-pay | Admitting: Family Medicine

## 2020-10-26 LAB — CMP14+EGFR
ALT: 23 [IU]/L (ref 0–32)
AST: 17 [IU]/L (ref 0–40)
Albumin/Globulin Ratio: 1.6 (ref 1.2–2.2)
Albumin: 4.6 g/dL (ref 3.9–5.0)
Alkaline Phosphatase: 65 [IU]/L (ref 44–121)
BUN/Creatinine Ratio: 10 (ref 9–23)
BUN: 7 mg/dL (ref 6–20)
Bilirubin Total: 0.6 mg/dL (ref 0.0–1.2)
CO2: 21 mmol/L (ref 20–29)
Calcium: 8.8 mg/dL (ref 8.7–10.2)
Chloride: 103 mmol/L (ref 96–106)
Creatinine, Ser: 0.68 mg/dL (ref 0.57–1.00)
Globulin, Total: 2.8 g/dL (ref 1.5–4.5)
Glucose: 84 mg/dL (ref 65–99)
Potassium: 3.9 mmol/L (ref 3.5–5.2)
Sodium: 140 mmol/L (ref 134–144)
Total Protein: 7.4 g/dL (ref 6.0–8.5)
eGFR: 122 mL/min/{1.73_m2}

## 2020-10-26 LAB — CBC WITH DIFFERENTIAL/PLATELET
Basophils Absolute: 0.1 10*3/uL (ref 0.0–0.2)
Basos: 0 %
EOS (ABSOLUTE): 0.6 10*3/uL — ABNORMAL HIGH (ref 0.0–0.4)
Eos: 5 %
Hematocrit: 42.3 % (ref 34.0–46.6)
Hemoglobin: 14.6 g/dL (ref 11.1–15.9)
Immature Grans (Abs): 0 10*3/uL (ref 0.0–0.1)
Immature Granulocytes: 0 %
Lymphocytes Absolute: 3.6 10*3/uL — ABNORMAL HIGH (ref 0.7–3.1)
Lymphs: 33 %
MCH: 31.7 pg (ref 26.6–33.0)
MCHC: 34.5 g/dL (ref 31.5–35.7)
MCV: 92 fL (ref 79–97)
Monocytes Absolute: 0.7 10*3/uL (ref 0.1–0.9)
Monocytes: 6 %
Neutrophils Absolute: 6.2 10*3/uL (ref 1.4–7.0)
Neutrophils: 56 %
Platelets: 297 10*3/uL (ref 150–450)
RBC: 4.61 x10E6/uL (ref 3.77–5.28)
RDW: 11.6 % — ABNORMAL LOW (ref 11.7–15.4)
WBC: 11.1 10*3/uL — ABNORMAL HIGH (ref 3.4–10.8)

## 2020-10-26 NOTE — Telephone Encounter (Signed)
Pt sent question about possible COVID rash from having COVID in January.  Unsure how to advise.

## 2020-10-27 ENCOUNTER — Other Ambulatory Visit: Payer: Self-pay | Admitting: Family Medicine

## 2020-10-27 DIAGNOSIS — R21 Rash and other nonspecific skin eruption: Secondary | ICD-10-CM

## 2020-10-30 ENCOUNTER — Other Ambulatory Visit: Payer: Self-pay | Admitting: Family Medicine

## 2020-10-30 DIAGNOSIS — R21 Rash and other nonspecific skin eruption: Secondary | ICD-10-CM

## 2020-10-30 IMAGING — US US OB < 14 WEEKS - US OB TV
1 series · 15 of 28 positions shown · non-contrast
Comparison: None.

CLINICAL DATA: Pregnant patient in first-trimester pregnancy with
abdominal pain. Nine weeks by last menstrual period.

EXAM:
OBSTETRIC <14 WK US AND TRANSVAGINAL OB US
TECHNIQUE: Both transabdominal and transvaginal ultrasound examinations were
performed for complete evaluation of the gestation as well as the
maternal uterus, adnexal regions, and pelvic cul-de-sac.
Transvaginal technique was performed to assess early pregnancy.

[Series 1: us ob < 14 weeks - us ob tv · 15 of 36 slices shown]
[im 1/36]
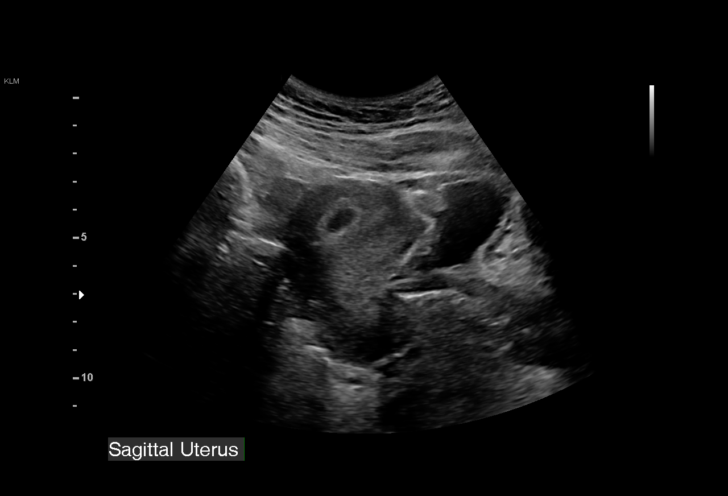
[im 3/36]
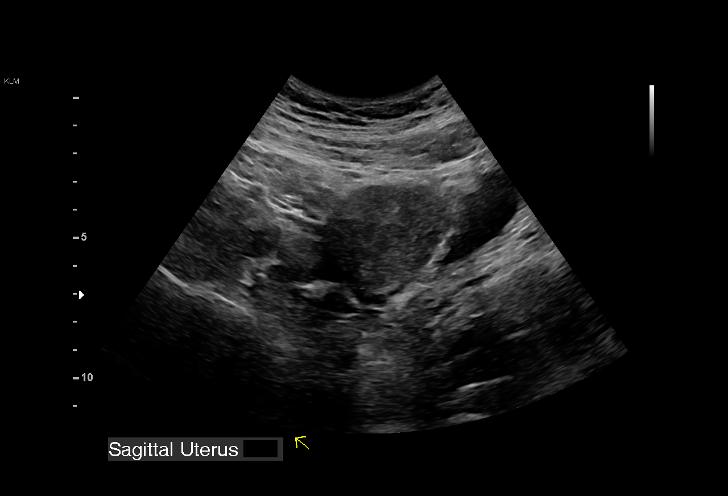
[im 6/36]
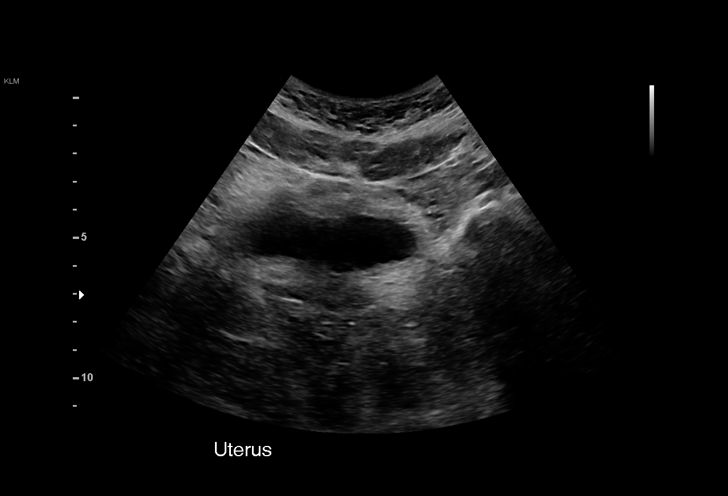
[im 8/36]
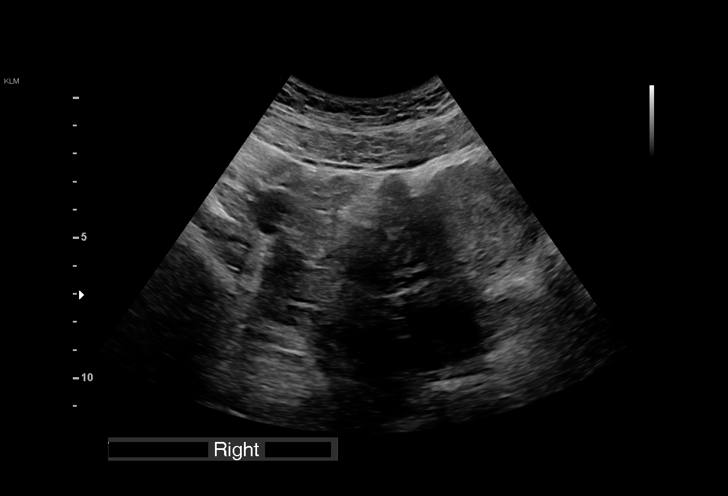
[im 11/36]
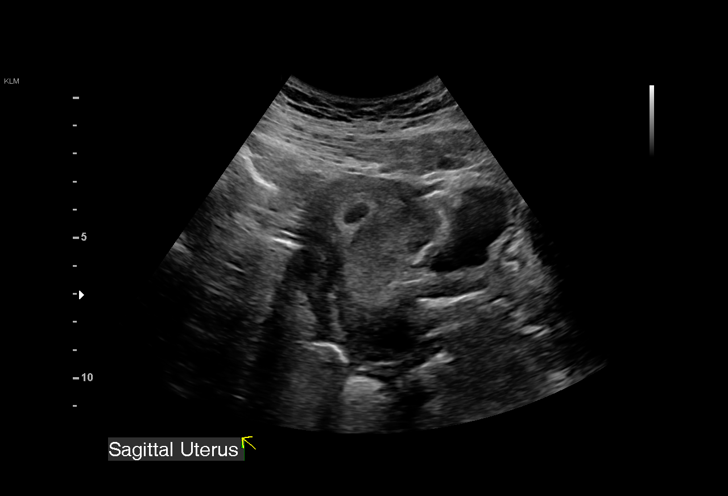
[im 13/36]
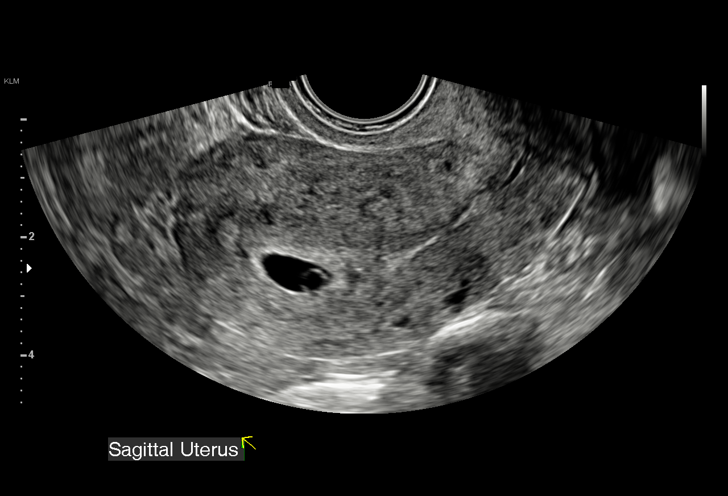
[im 16/36]
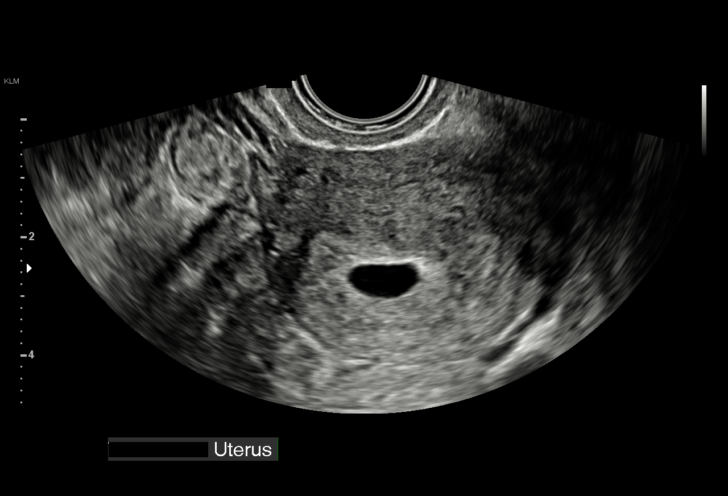
[im 19/36]
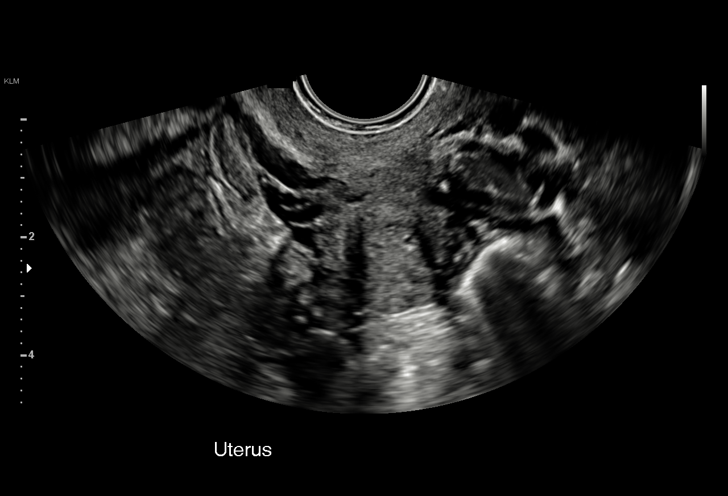
[im 20/36]
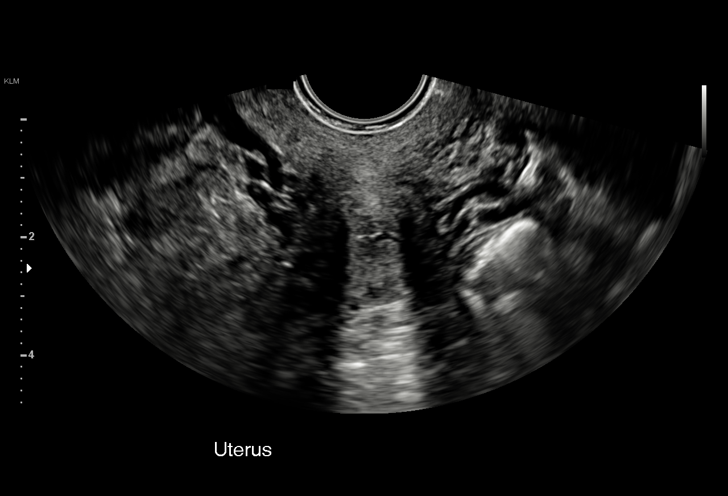
[im 23/36]
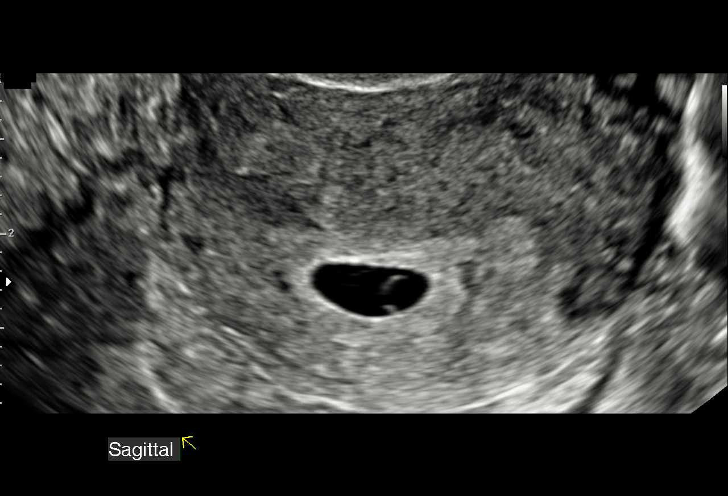
[im 25/36]
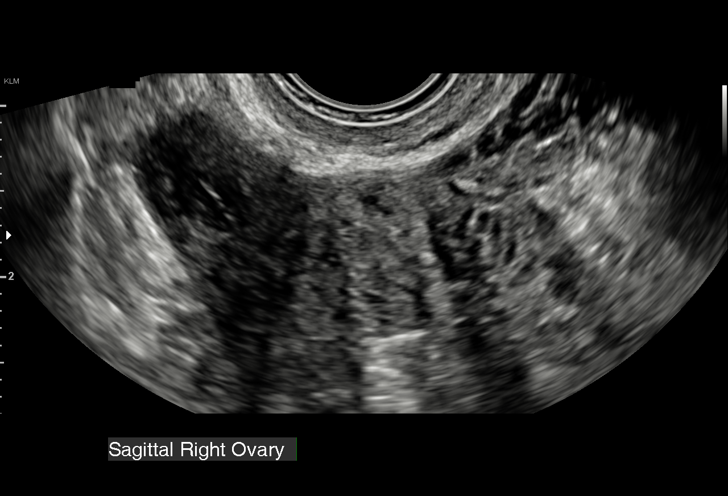
[im 28/36]
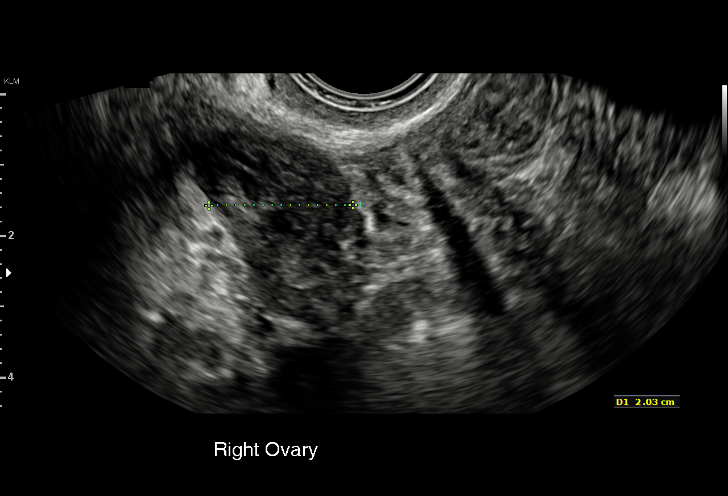
[im 30/36]
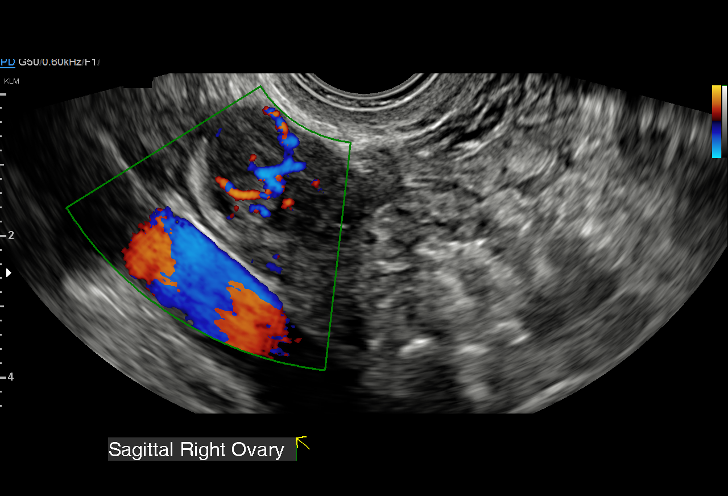
[im 33/36]
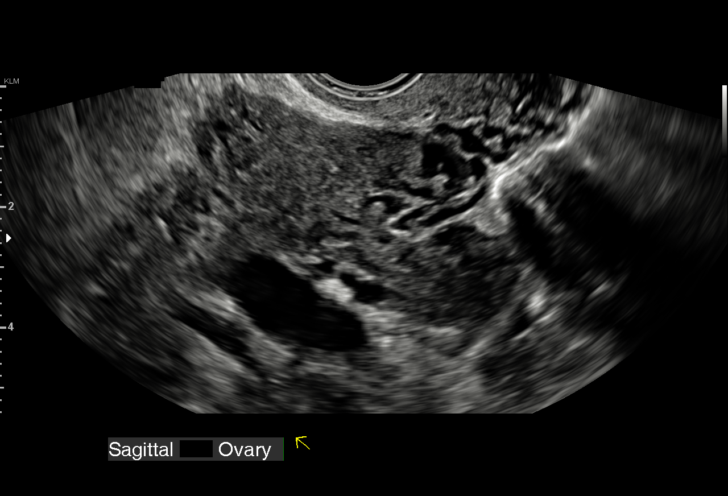
[im 36/36]
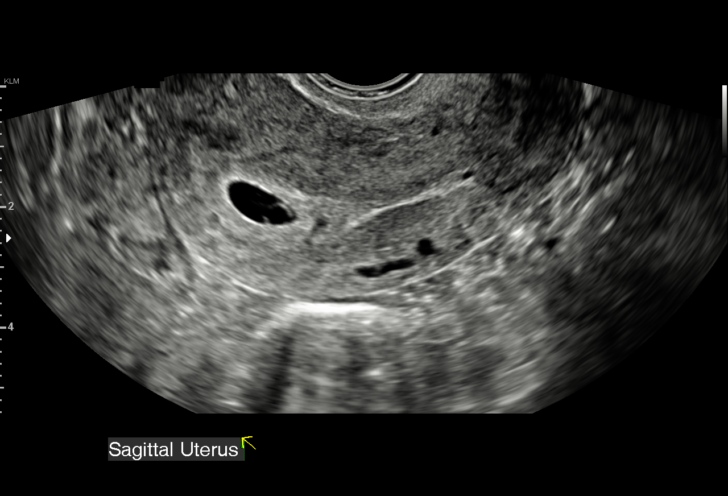

[15 of 28 positions shown; findings below may reference images not displayed]

FINDINGS: Intrauterine gestational sac: Single

Yolk sac:  Visualized.

Embryo:  Not Visualized.

MSD: 10.35 mm   5 w   5 d

Subchorionic hemorrhage:  None visualized.

Maternal uterus/adnexae: Intrauterine gestational sac containing a
yolk sac but no fetal pole. No subchorionic hemorrhage. The uterus
is unremarkable. The right ovary appears normal measuring 3.1 x
x 2.0 cm and contains a small corpus luteal cyst. Blood flow is
noted. The left ovary is normal measuring 1.7 x 2.7 x 1.9 cm. There
is no adnexal mass or pelvic free fluid.
IMPRESSION: 1. Early intrauterine pregnancy with gestational sac and yolk sac,
but no fetal pole. Recommend follow-up quantitative B-HCG levels and
follow-up US in 10-14 days to assess viability. This recommendation
follows SRU consensus guidelines: Diagnostic Criteria for Nonviable
Pregnancy Early in the First Trimester. N Engl J Med 0096;
2. Normal sonographic appearance of the ovaries. No adnexal mass or
pelvic free fluid.

## 2020-10-30 MED ORDER — CEPHALEXIN 500 MG PO CAPS: 500.0000 mg | ORAL_CAPSULE | Freq: Two times a day (BID) | ORAL | 0 refills | Status: DC

## 2020-10-30 NOTE — Progress Notes (Signed)
Preliminary culture results came back. Stop doxycycline and start Keflex twice a day for 10 days.

## 2020-10-31 LAB — WOUND CULTURE

## 2020-11-02 ENCOUNTER — Other Ambulatory Visit: Payer: Self-pay

## 2020-11-02 ENCOUNTER — Encounter: Payer: Self-pay | Admitting: Registered Nurse

## 2020-11-02 ENCOUNTER — Ambulatory Visit (INDEPENDENT_AMBULATORY_CARE_PROVIDER_SITE_OTHER): Payer: Self-pay | Admitting: Registered Nurse

## 2020-11-02 VITALS — BP 132/83 | HR 67 | Temp 97.9°F | Resp 16 | Ht 68.0 in | Wt 212.3 lb

## 2020-11-02 DIAGNOSIS — E65 Localized adiposity: Secondary | ICD-10-CM

## 2020-11-02 DIAGNOSIS — R4589 Other symptoms and signs involving emotional state: Secondary | ICD-10-CM

## 2020-11-02 DIAGNOSIS — L906 Striae atrophicae: Secondary | ICD-10-CM

## 2020-11-02 NOTE — Progress Notes (Signed)
Established Patient Office Visit  Subjective:  Patient ID: Sara Frost, female    DOB: 03-06-1991  Age: 30 y.o. MRN: [id number]  CC:  Chief Complaint  Patient presents with  . Depression    Pt here to request ESA letter, she is from [place] where she had a doctor witting these but needs a provider in state for her new apartment here. Has a Dog she has kept as an ESA for the past 5 years since her mothers passing in 2015.     HPI Sara Frost presents for emotional support animal   Has had this animal for some time, since before moving to [facility] Provider in [place] had provided documentation but she no longer has this Needs documentation for landlord.  Notes rash from previous visit much improved, no ongoing concerns She was able to contact dermatology, visit would have been $3-600, opts to skip given improvement Similarly, wants to delay referral to endocrinology for potential cushing as she does not have the money at this time.  Otherwise no concerns  Past Medical History:  Diagnosis Date  . Fatty liver     Past Surgical History:  Procedure Laterality Date  . BONE EXOSTOSIS EXCISION Left 09/08/2019   Procedure: LEFT WRIST EXOSTOSIS EXCISION;  Surgeon: Betha Loa, MD;  Location: [facility];  Service: Orthopedics;  Laterality: Left;  . CLAVICLE SURGERY    . OPEN REDUCTION INTERNAL FIXATION (ORIF) DISTAL RADIAL FRACTURE Left 02/24/2019   Procedure: OPEN REDUCTION INTERNAL FIXATION (ORIF) LEFT DISTAL RADIAL FRACTURE;  Surgeon: Betha Loa, MD;  Location: [facility];  Service: Orthopedics;  Laterality: Left;  . WRIST ARTHROSCOPY  09/2019    Family History  Problem Relation Age of Onset  . Lung cancer Mother   . Liver cancer Maternal Grandmother   . Pancreatic cancer Paternal Grandfather     Social History   Socioeconomic History  . Marital status: Significant Other    Spouse name: Not on file  . Number of children: Not on file  . Years of  education: Not on file  . Highest education level: Not on file  Occupational History  . Not on file  Tobacco Use  . Smoking status: Never Smoker  . Smokeless tobacco: Never Used  Vaping Use  . Vaping Use: Never used  Substance and Sexual Activity  . Alcohol use: Never  . Drug use: Yes    Frequency: 7.0 times per week    Types: Marijuana    Comment: uses daily for nausea  . Sexual activity: Not Currently    Birth control/protection: None  Other Topics Concern  . Not on file  Social History Narrative  . Not on file   Social Determinants of Health   Financial Resource Strain: Not on file  Food Insecurity: Food Insecurity Present  . Worried About Programme researcher, broadcasting/film/video in the Last Year: Sometimes true  . Ran Out of Food in the Last Year: Sometimes true  Transportation Needs: No Transportation Needs  . Lack of Transportation (Medical): No  . Lack of Transportation (Non-Medical): No  Physical Activity: Not on file  Stress: Not on file  Social Connections: Not on file  Intimate Partner Violence: Not on file    Outpatient Medications Prior to Visit  Medication Sig Dispense Refill  . loratadine (CLARITIN) 10 MG tablet Take 10 mg by mouth daily as needed for allergies.    . fluticasone (CUTIVATE) 0.05 % cream Apply topically 2 (two) times daily. 30 g  0  . cephALEXin (KEFLEX) 500 MG capsule Take 1 capsule (500 mg total) by mouth 2 (two) times daily for 10 days. (Patient not taking: Reported on 11/02/2020) 20 capsule 0  . doxycycline (VIBRAMYCIN) 100 MG capsule Take 1 capsule (100 mg total) by mouth 2 (two) times daily for 14 days. (Patient not taking: Reported on 11/02/2020) 28 capsule 0  . famotidine (PEPCID) 20 MG tablet Take 1 tablet (20 mg total) by mouth daily. (Patient not taking: Reported on 10/25/2020) 30 tablet 1  . ondansetron (ZOFRAN) 4 MG tablet Take 1 tablet (4 mg total) by mouth every 8 (eight) hours as needed for nausea or vomiting. (Patient not taking: No sig reported) 90  tablet 1  . Prenatal Vit-Fe Fumarate-FA (MULTIVITAMIN-PRENATAL) 27-0.8 MG TABS tablet Take 1 tablet by mouth daily at 12 noon. (Patient not taking: Reported on 10/25/2020)     No facility-administered medications prior to visit.    Allergies  Allergen Reactions  . Penicillins Nausea Only    Has patient had a PCN reaction causing immediate rash, facial/tongue/throat swelling, SOB or lightheadedness with hypotension: Y Has patient had a PCN reaction causing severe rash involving mucus membranes or skin necrosis: Y Has patient had a PCN reaction that required hospitalization: N Has patient had a PCN reaction occurring within the last 10 years: Y If all of the above answers are "NO", then may proceed with Cephalosporin use.   . Vicodin [Hydrocodone-Acetaminophen] Nausea And Vomiting    ROS Review of Systems  Constitutional: Negative.   HENT: Negative.   Eyes: Negative.   Respiratory: Negative.   Cardiovascular: Negative.   Gastrointestinal: Negative.   Genitourinary: Negative.   Musculoskeletal: Negative.   Skin: Negative.   Neurological: Negative.   Psychiatric/Behavioral: Negative.   All other systems reviewed and are negative.     Objective:    Physical Exam Vitals and nursing note reviewed.  Constitutional:      General: She is not in acute distress.    Appearance: Normal appearance. She is normal weight. She is not ill-appearing, toxic-appearing or diaphoretic.  Cardiovascular:     Rate and Rhythm: Normal rate and regular rhythm.     Heart sounds: Normal heart sounds. No murmur heard. No friction rub. No gallop.   Pulmonary:     Effort: Pulmonary effort is normal. No respiratory distress.     Breath sounds: Normal breath sounds. No stridor. No wheezing, rhonchi or rales.  Chest:     Chest wall: No tenderness.  Skin:    General: Skin is warm and dry.  Neurological:     General: No focal deficit present.     Mental Status: She is alert and oriented to person,  place, and time. Mental status is at baseline.  Psychiatric:        Mood and Affect: Mood normal.        Behavior: Behavior normal.        Thought Content: Thought content normal.        Judgment: Judgment normal.     BP 132/83   Pulse 67   Temp 97.9 F (36.6 C) (Temporal)   Resp 16   Ht 5\' 8"  (1.727 m)   Wt 212 lb 4.8 oz (96.3 kg)   LMP 11/02/2019   SpO2 96%   BMI 32.28 kg/m  Wt Readings from Last 3 Encounters:  11/02/20 212 lb 4.8 oz (96.3 kg)  10/25/20 212 lb (96.2 kg)  07/24/20 202 lb (91.6 kg)  There are no preventive care reminders to display for this patient.  There are no preventive care reminders to display for this patient.  Lab Results  Component Value Date   TSH 0.971 07/21/2020   Lab Results  Component Value Date   WBC 11.1 (H) 10/25/2020   HGB 14.6 10/25/2020   HCT 42.3 10/25/2020   MCV 92 10/25/2020   PLT 297 10/25/2020   Lab Results  Component Value Date   NA 140 10/25/2020   K 3.9 10/25/2020   CO2 21 10/25/2020   GLUCOSE 84 10/25/2020   BUN 7 10/25/2020   CREATININE 0.68 10/25/2020   BILITOT 0.6 10/25/2020   ALKPHOS 65 10/25/2020   AST 17 10/25/2020   ALT 23 10/25/2020   PROT 7.4 10/25/2020   ALBUMIN 4.6 10/25/2020   CALCIUM 8.8 10/25/2020   ANIONGAP 12 07/21/2020   No results found for: CHOL No results found for: HDL No results found for: LDLCALC No results found for: TRIG No results found for: CHOLHDL No results found for: CBJS2G    Assessment & Plan:   Problem List Items Addressed This Visit   None   Visit Diagnoses    Need for emotional support    -  Primary   Relevant Orders   Ambulatory referral to Psychiatry   Stretch marks       Dorsal cervical fat pad          No orders of the defined types were placed in this encounter.   Follow-up: No follow-ups on file.   PLAN  Per practice policy, refer to psychiatry  Provided temporary letter of support  Patient encouraged to call clinic with any questions,  comments, or concerns.  Janeece Agee, NP

## 2020-11-02 NOTE — Patient Instructions (Signed)
° ° ° °  If you have lab work done today you will be contacted with your lab results within the next 2 weeks.  If you have not heard from us then please contact us. The fastest way to get your results is to register for My Chart. ° ° °IF you received an x-ray today, you will receive an invoice from [facility]. Please contact [facility] at [phone] with questions or concerns regarding your invoice.  ° °IF you received labwork today, you will receive an invoice from [facility]. Please contact [facility] at [phone] with questions or concerns regarding your invoice.  ° °Our billing staff will not be able to assist you with questions regarding bills from these companies. ° °You will be contacted with the lab results as soon as they are available. The fastest way to get your results is to activate your My Chart account. Instructions are located on the last page of this paperwork. If you have not heard from us regarding the results in 2 weeks, please contact this office. °  ° ° ° °

## 2020-11-04 IMAGING — US US OB TRANSVAGINAL
1 series · 15 of 21 positions shown · non-contrast
Comparison: January 04, 2020.

CLINICAL DATA: Vaginal bleeding, lower abdominal pain, first
trimester of pregnancy.

EXAM:
TRANSVAGINAL OB ULTRASOUND
TECHNIQUE: Transvaginal ultrasound was performed for complete evaluation of the
gestation as well as the maternal uterus, adnexal regions, and
pelvic cul-de-sac.

[Series 1: us ob transvaginal · 15 of 21 slices shown]
[im 1/21]
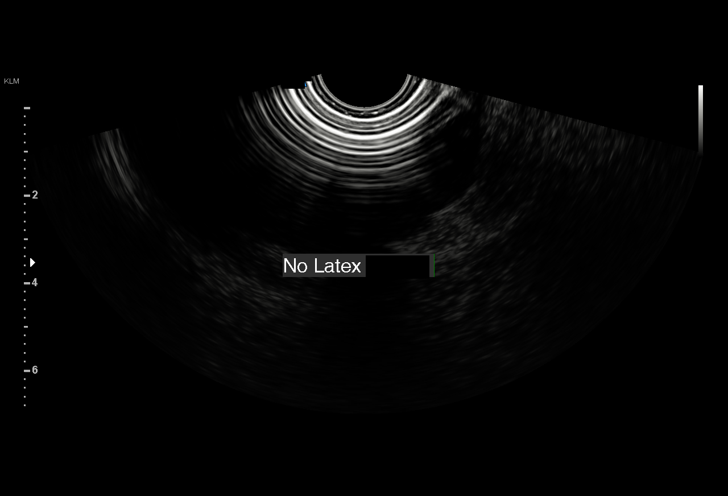
[im 3/21]
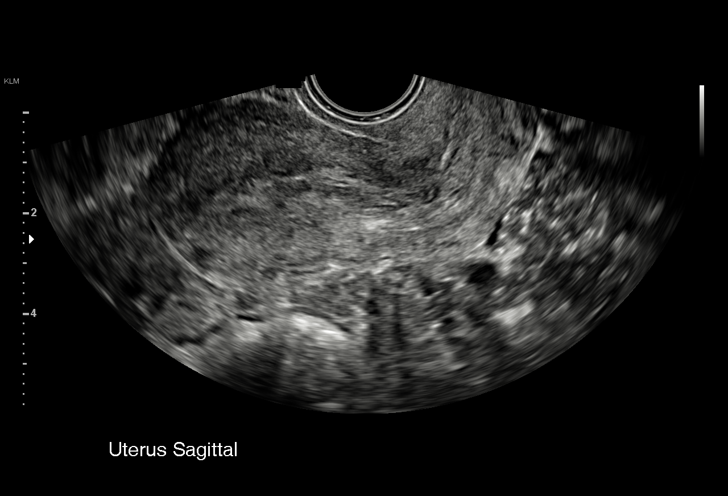
[im 4/21]
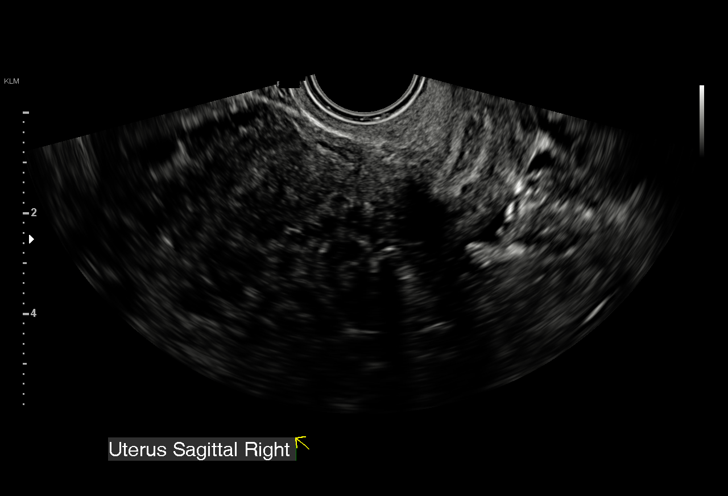
[im 5/21]
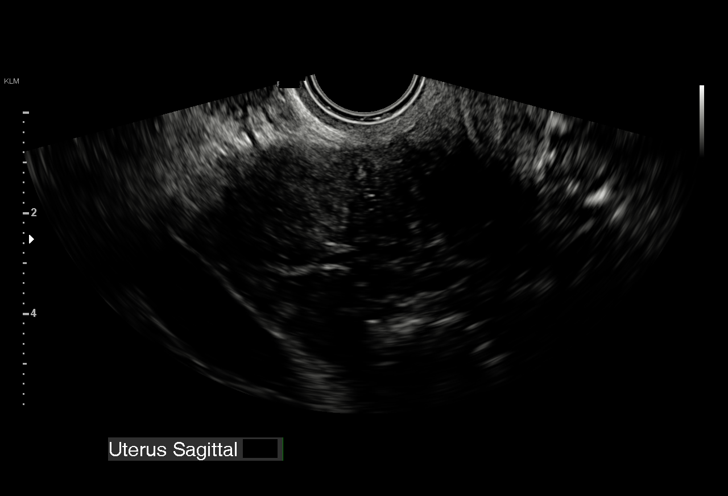
[im 7/21]
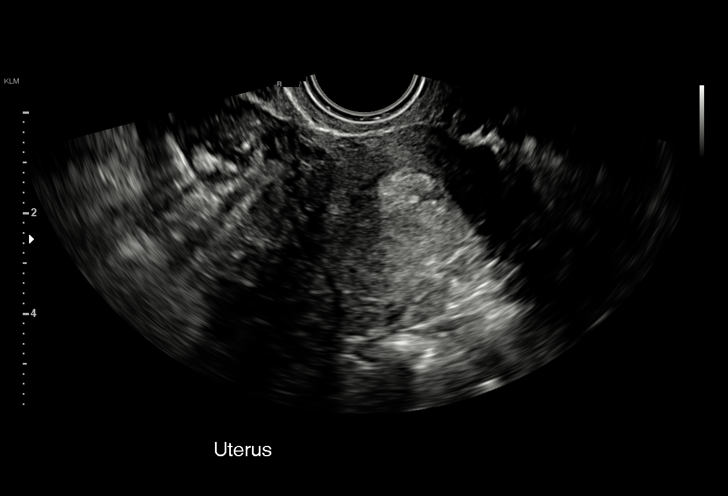
[im 8/21]
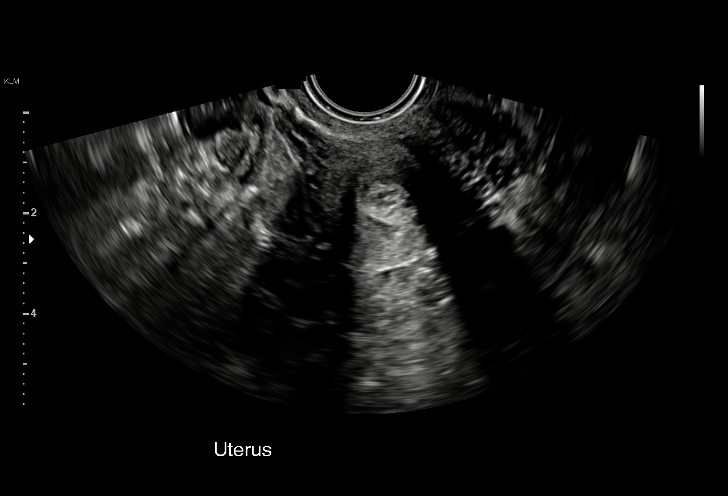
[im 10/21]
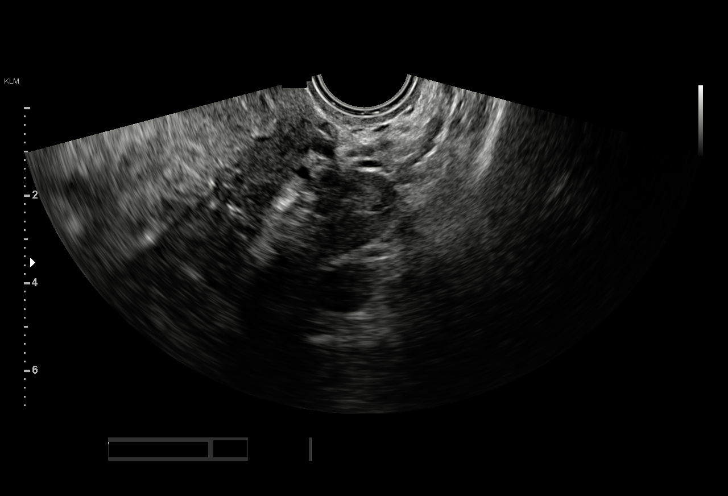
[im 11/21]
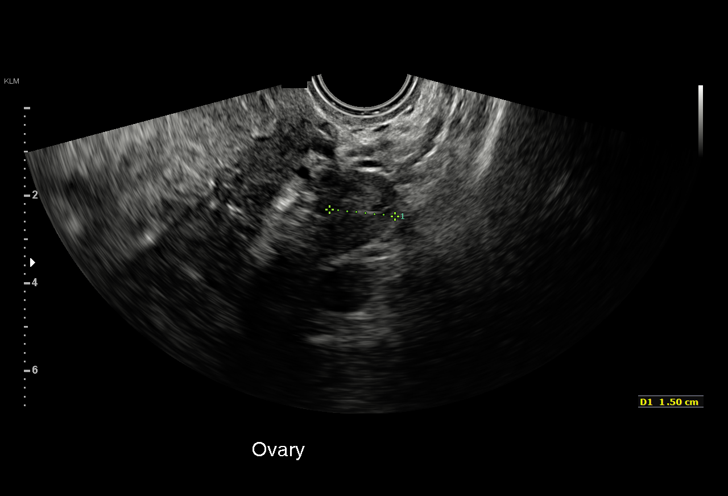
[im 12/21]
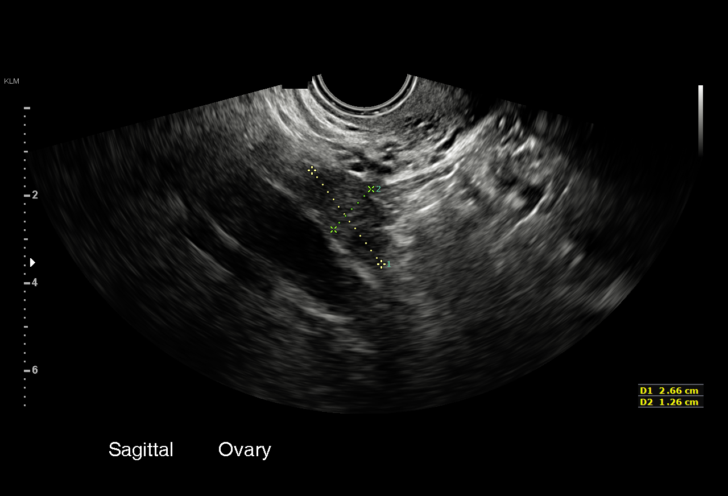
[im 14/21]
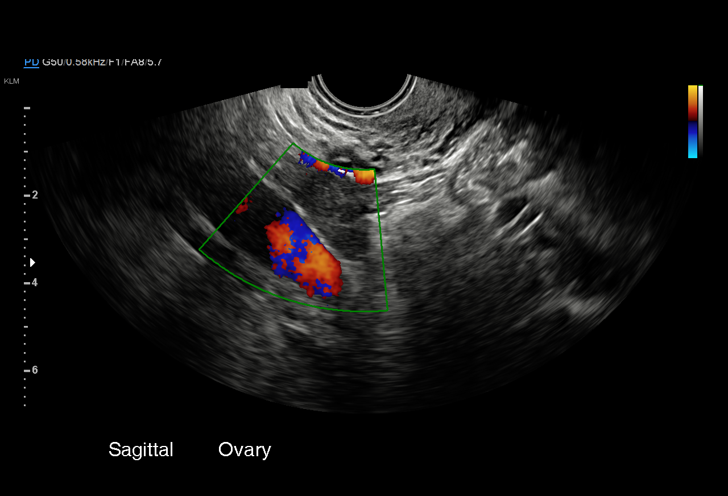
[im 15/21]
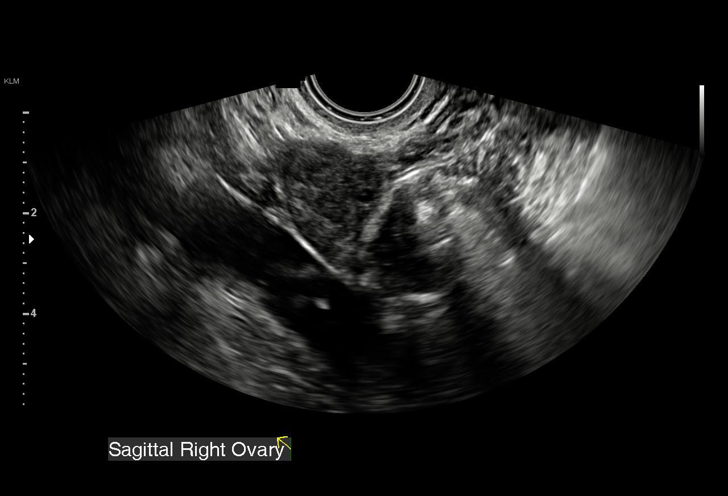
[im 17/21]
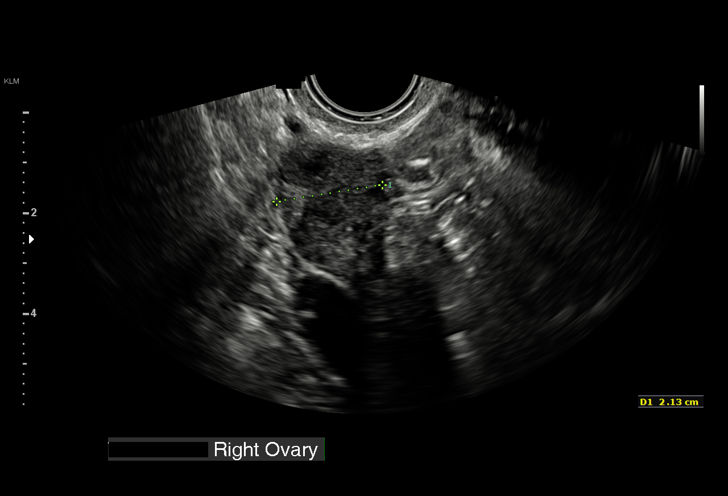
[im 18/21]
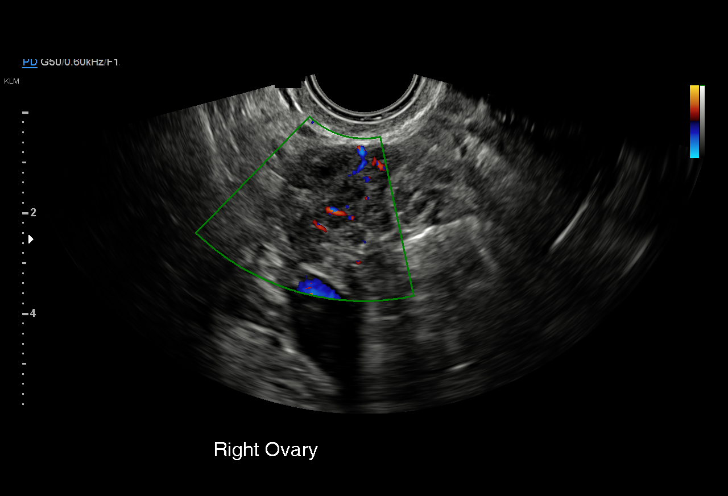
[im 19/21]
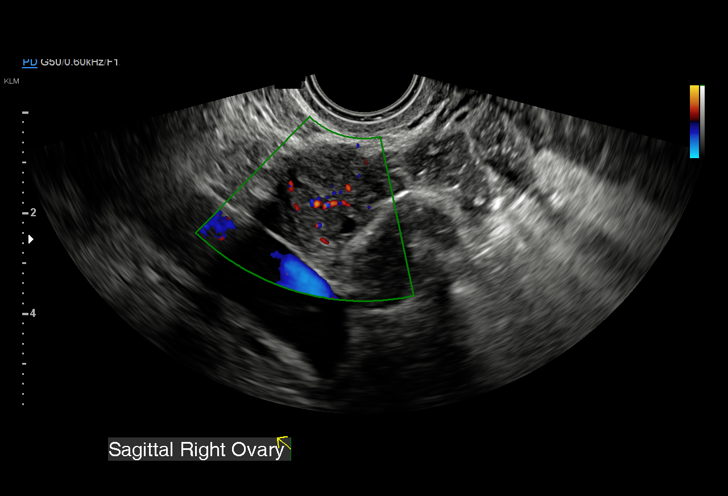
[im 21/21]
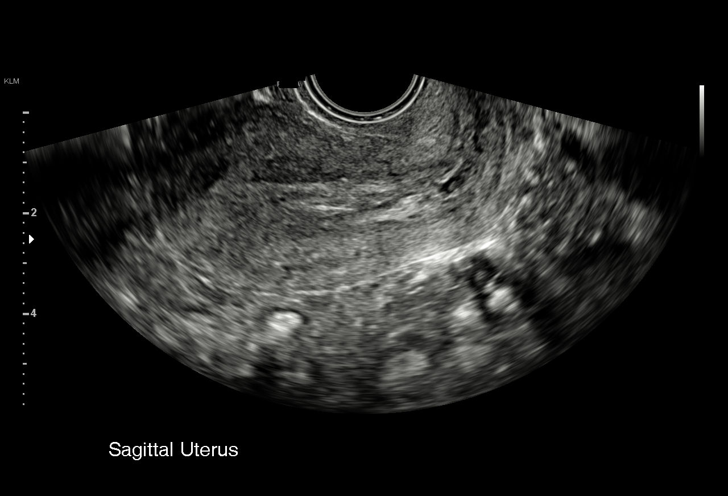

[15 of 21 positions shown; findings below may reference images not displayed]

FINDINGS: Intrauterine gestational sac: None

Yolk sac:  Not Visualized.

Embryo:  Not Visualized.

Cardiac Activity: Not Visualized.

Maternal uterus/adnexae: Ovaries are unremarkable. No free fluid is
noted. Intrauterine fluid collection with yolk sac noted on prior
exam is no longer visualized. Endometrial thickening is noted.
IMPRESSION: Intrauterine gestational sac with yolk sac noted on prior exam is no
longer visualized. Thickened endometrium remains. These findings are
most consistent with missed abortion.

## 2020-11-07 ENCOUNTER — Telehealth: Payer: Self-pay | Admitting: General Practice

## 2020-11-07 NOTE — Telephone Encounter (Signed)
Patient called to ask for provider to update letter written on 3/18; letter needs to state that patient needs an emotional support animal due to having a disability. Please call patient when letter is ready so she can pick it up. Her number is [phone].

## 2020-11-08 ENCOUNTER — Encounter: Payer: Self-pay | Admitting: Registered Nurse

## 2020-11-08 NOTE — Telephone Encounter (Signed)
Pt is calling checking on update letter and would like to pick up today

## 2020-11-08 NOTE — Telephone Encounter (Signed)
Please advise 

## 2020-11-12 NOTE — Telephone Encounter (Signed)
For that I would need her to see psychiatry, I cannot diagnose a disability that would warrant this. Unfortunately the most I can do is the temporary letter that I provided to her.  Thank you  Rich

## 2020-11-13 NOTE — Telephone Encounter (Signed)
Please advise 

## 2020-11-13 NOTE — Telephone Encounter (Signed)
Pt called in this morning stating that she needs the letter to included that she needs emotional support animals due to a disability.  She asked if this could be done today as the apartment complex is no longer going to hold the apartment for her.  She would like this uploaded to mychart and please call her when this has been taken care of.  Pt can be reach at [phone]

## 2020-11-16 NOTE — Telephone Encounter (Signed)
Attempted to call the patient to let her know that morrow is on vacation and see if there is anything I can do to help? Left voicemail for her to return the call.

## 2023-02-16 ENCOUNTER — Ambulatory Visit (INDEPENDENT_AMBULATORY_CARE_PROVIDER_SITE_OTHER): Payer: 59

## 2023-02-16 ENCOUNTER — Ambulatory Visit
Admission: EM | Admit: 2023-02-16 | Discharge: 2023-02-16 | Disposition: A | Discharge status: Home / Self Care | Payer: 59 | Attending: Urgent Care | Admitting: Urgent Care

## 2023-02-16 DIAGNOSIS — M25562 Pain in left knee: Secondary | ICD-10-CM

## 2023-02-16 MED ORDER — NAPROXEN 500 MG PO TABS
500.0000 mg | ORAL_TABLET | Freq: Two times a day (BID) | ORAL | 0 refills | Status: AC
Start: 2023-02-16 — End: ?

## 2023-02-16 NOTE — ED Provider Notes (Signed)
[facility]  Note:  This document was prepared using Conservation officer, historic buildings and may include unintentional dictation errors.  MRN: [id number] DOB: [birth year]  Subjective:   Sara Frost is a 32 y.o. female presenting for 1 week history of acute onset persistent left knee pain, swelling, heaviness to her knee. No fall, trauma. Patient just came back from a trip, was very active. Normally she is but is more with skating with roller blades. No history of gout. Has concerns about malignancy as her mother was found to have this in her leg in her 56s. She does have buckling in her knee but no specific timeline for how long this has happened.   No current facility-administered medications for this encounter.  Current Outpatient Medications:    fluticasone (CUTIVATE) 0.05 % cream, Apply topically 2 (two) times daily., Disp: 30 g, Rfl: 0   loratadine (CLARITIN) 10 MG tablet, Take 10 mg by mouth daily as needed for allergies., Disp: , Rfl:    Allergies  Allergen Reactions   Penicillins Nausea Only    Has patient had a PCN reaction causing immediate rash, facial/tongue/throat swelling, SOB or lightheadedness with hypotension: Y Has patient had a PCN reaction causing severe rash involving mucus membranes or skin necrosis: Y Has patient had a PCN reaction that required hospitalization: N Has patient had a PCN reaction occurring within the last 10 years: Y If all of the above answers are "NO", then may proceed with Cephalosporin use.    Vicodin [Hydrocodone-Acetaminophen] Nausea And Vomiting    Past Medical History:  Diagnosis Date   Fatty liver      Past Surgical History:  Procedure Laterality Date   BONE EXOSTOSIS EXCISION Left 09/08/2019   Procedure: LEFT WRIST EXOSTOSIS EXCISION;  Surgeon: Betha Loa, MD;  Location: [facility];  Service: Orthopedics;  Laterality: Left;   CLAVICLE SURGERY     OPEN REDUCTION INTERNAL FIXATION (ORIF)  DISTAL RADIAL FRACTURE Left 02/24/2019   Procedure: OPEN REDUCTION INTERNAL FIXATION (ORIF) LEFT DISTAL RADIAL FRACTURE;  Surgeon: Betha Loa, MD;  Location: [facility];  Service: Orthopedics;  Laterality: Left;   WRIST ARTHROSCOPY  09/2019    Family History  Problem Relation Age of Onset   Lung cancer Mother    Liver cancer Maternal Grandmother    Pancreatic cancer Paternal Grandfather     Social History   Tobacco Use   Smoking status: Never   Smokeless tobacco: Never  Vaping Use   Vaping Use: Never used  Substance Use Topics   Alcohol use: Never   Drug use: Yes    Types: Marijuana    ROS   Objective:   Vitals: BP (!) 155/92 (BP Location: Left Arm)   Pulse 83   Temp 98.6 F (37 C) (Oral)   Resp 20   LMP 02/05/2023   SpO2 98%   Physical Exam Constitutional:      General: She is not in acute distress.    Appearance: Normal appearance. She is well-developed. She is not ill-appearing, toxic-appearing or diaphoretic.  HENT:     Head: Normocephalic and atraumatic.     Nose: Nose normal.     Mouth/Throat:     Mouth: Mucous membranes are moist.  Eyes:     General: No scleral icterus.       Right eye: No discharge.        Left eye: No discharge.     Extraocular Movements: Extraocular movements intact.  Cardiovascular:  Rate and Rhythm: Normal rate.  Pulmonary:     Effort: Pulmonary effort is normal.  Musculoskeletal:     Left knee: Swelling (trace) present. No deformity, effusion, erythema, ecchymosis, lacerations, bony tenderness or crepitus. Normal range of motion. No tenderness. Normal patellar mobility.  Skin:    General: Skin is warm and dry.  Neurological:     General: No focal deficit present.     Mental Status: She is alert and oriented to person, place, and time.  Psychiatric:        Mood and Affect: Mood normal.        Behavior: Behavior normal.     DG Knee Complete 4 Views Left  Result Date: 02/16/2023 CLINICAL DATA:  Left  knee pain, swelling EXAM: LEFT KNEE - COMPLETE 4+ VIEW COMPARISON:  None Available. FINDINGS: No evidence of fracture, dislocation, or joint effusion. No evidence of arthropathy or other focal bone abnormality. Soft tissues are unremarkable. IMPRESSION: Negative. Electronically Signed   By: Helyn Numbers M.D.   On: 02/16/2023 20:07    Applied a 4" Ace wrap to the left knee.   Assessment and Plan :   PDMP not reviewed this encounter.  1. Acute pain of left knee    Suspect inflammatory pain due to overuse. Recommended RICE method. Follow up with ortho. Counseled patient on potential for adverse effects with medications prescribed/recommended today, ER and return-to-clinic precautions discussed, patient verbalized understanding.    Wallis Bamberg, [place] 02/17/23 [phone]

## 2023-02-16 NOTE — ED Triage Notes (Signed)
Pt c/o pain, swelling, "heavy" to left knee x 1 week-denies injury-NAD-steady gait

## 2023-10-23 ENCOUNTER — Ambulatory Visit
Admission: EM | Admit: 2023-10-23 | Discharge: 2023-10-23 | Disposition: A | Discharge status: Home / Self Care | Attending: Family Medicine | Admitting: Family Medicine

## 2023-10-23 DIAGNOSIS — J069 Acute upper respiratory infection, unspecified: Secondary | ICD-10-CM

## 2023-10-23 LAB — POCT INFLUENZA A/B
Influenza A, POC: NEGATIVE
Influenza B, POC: NEGATIVE

## 2023-10-23 MED ORDER — PREDNISONE 20 MG PO TABS: 40.0000 mg | ORAL_TABLET | Freq: Every day | ORAL | 0 refills | Status: AC

## 2023-10-23 MED ORDER — BENZONATATE 200 MG PO CAPS: 200.0000 mg | ORAL_CAPSULE | Freq: Three times a day (TID) | ORAL | 0 refills | Status: DC | PRN

## 2023-10-23 MED ORDER — ALBUTEROL SULFATE HFA 108 (90 BASE) MCG/ACT IN AERS: 1.0000 | INHALATION_SPRAY | Freq: Four times a day (QID) | RESPIRATORY_TRACT | 0 refills | Status: AC | PRN

## 2023-10-23 NOTE — ED Provider Notes (Signed)
 [facility]    CSN: [id number] Arrival date & time: 10/23/23  1902      History   Chief Complaint Chief Complaint  Patient presents with   Cough   Wheezing   Medication Refill    Inhaler    HPI Sara Frost is a 33 y.o. female  presents for evaluation of URI symptoms for 3 days. Patient reports associated symptoms of cough, can ration, wheezing and shortness of breath, and some posttussive vomiting. Denies nausea, diarrhea, ear pain, fevers, body aches. Patient does not have a hx of asthma. Patient is an active smoker.    Pt has taken Mucinex OTC for symptoms. Pt has no other concerns at this time.    Cough Associated symptoms: wheezing   Wheezing Associated symptoms: cough   Medication Refill   Past Medical History:  Diagnosis Date   Fatty liver     Patient Active Problem List   Diagnosis Date Noted   Supervision of high risk pregnancy, antepartum 08/01/2020   Fatty liver    Hypertension affecting pregnancy    Exostosis 08/30/2019    Past Surgical History:  Procedure Laterality Date   BONE EXOSTOSIS EXCISION Left 09/08/2019   Procedure: LEFT WRIST EXOSTOSIS EXCISION;  Surgeon: Betha Loa, MD;  Location: [facility];  Service: Orthopedics;  Laterality: Left;   CLAVICLE SURGERY     OPEN REDUCTION INTERNAL FIXATION (ORIF) DISTAL RADIAL FRACTURE Left 02/24/2019   Procedure: OPEN REDUCTION INTERNAL FIXATION (ORIF) LEFT DISTAL RADIAL FRACTURE;  Surgeon: Betha Loa, MD;  Location: [facility];  Service: Orthopedics;  Laterality: Left;   WRIST ARTHROSCOPY  09/2019    OB History     Gravida  3   Para  0   Term  0   Preterm  0   AB  2   Living  0      SAB  1   IAB  1   Ectopic  0   Multiple  0   Live Births  0            Home Medications    Prior to Admission medications   Medication Sig Start Date End Date Taking? Authorizing Provider  albuterol (VENTOLIN HFA) 108 (90 Base) MCG/ACT inhaler  Inhale 1-2 puffs into the lungs every 6 (six) hours as needed. 10/23/23  Yes Radford Pax, NP  benzonatate (TESSALON) 200 MG capsule Take 1 capsule (200 mg total) by mouth 3 (three) times daily as needed. 10/23/23  Yes Radford Pax, NP  predniSONE (DELTASONE) 20 MG tablet Take 2 tablets (40 mg total) by mouth daily with breakfast for 3 days. 10/23/23 10/26/23 Yes Radford Pax, NP  fluticasone (CUTIVATE) 0.05 % cream Apply topically 2 (two) times daily. 10/25/20   Just, Azalee Course, FNP  loratadine (CLARITIN) 10 MG tablet Take 10 mg by mouth daily as needed for allergies.    [provider]  naproxen (NAPROSYN) 500 MG tablet Take 1 tablet (500 mg total) by mouth 2 (two) times daily with a meal. 02/16/23   Wallis Bamberg, PA-C    Family History Family History  Problem Relation Age of Onset   Lung cancer Mother    Liver cancer Maternal Grandmother    Pancreatic cancer Paternal Grandfather     Social History Social History   Tobacco Use   Smoking status: Never   Smokeless tobacco: Never  Vaping Use   Vaping status: Never Used  Substance Use Topics   Alcohol  use: Never   Drug use: Yes    Types: Marijuana     Allergies   Penicillins and Vicodin [hydrocodone-acetaminophen]   Review of Systems Review of Systems  HENT:  Positive for congestion.   Respiratory:  Positive for cough and wheezing.      Physical Exam Triage Vital Signs ED Triage Vitals  Encounter Vitals Group     BP 10/23/23 1912 (!) 148/91     Systolic BP Percentile --      Diastolic BP Percentile --      Pulse Rate 10/23/23 1912 76     Resp 10/23/23 1912 17     Temp 10/23/23 1912 98.2 F (36.8 C)     Temp Source 10/23/23 1912 Oral     SpO2 10/23/23 1912 97 %     Weight --      Height --      Head Circumference --      Peak Flow --      Pain Score 10/23/23 1911 0     Pain Loc --      Pain Education --      Exclude from Growth Chart --    No data found.  Updated Vital Signs BP (!) 148/91 (BP Location:  Right Arm)   Pulse 76   Temp 98.2 F (36.8 C) (Oral)   Resp 17   LMP 10/17/2023 (Exact Date)   SpO2 97%   Breastfeeding No   Visual Acuity Right Eye Distance:   Left Eye Distance:   Bilateral Distance:    Right Eye Near:   Left Eye Near:    Bilateral Near:     Physical Exam Vitals and nursing note reviewed.  Constitutional:      General: She is not in acute distress.    Appearance: She is well-developed. She is not ill-appearing.  HENT:     Head: Normocephalic and atraumatic.     Right Ear: Tympanic membrane and ear canal normal.     Left Ear: Tympanic membrane and ear canal normal.     Nose: Congestion present.     Mouth/Throat:     Mouth: Mucous membranes are moist.     Pharynx: Oropharynx is clear. Uvula midline. No oropharyngeal exudate or posterior oropharyngeal erythema.     Tonsils: No tonsillar exudate or tonsillar abscesses.  Eyes:     Conjunctiva/sclera: Conjunctivae normal.     Pupils: Pupils are equal, round, and reactive to light.  Cardiovascular:     Rate and Rhythm: Normal rate and regular rhythm.     Heart sounds: Normal heart sounds.  Pulmonary:     Effort: Pulmonary effort is normal.     Breath sounds: Normal breath sounds. No wheezing or rhonchi.  Musculoskeletal:     Cervical back: Normal range of motion and neck supple.  Lymphadenopathy:     Cervical: No cervical adenopathy.  Skin:    General: Skin is warm and dry.  Neurological:     General: No focal deficit present.     Mental Status: She is alert and oriented to person, place, and time.  Psychiatric:        Mood and Affect: Mood normal.        Behavior: Behavior normal.      UC Treatments / Results  Labs (all labs ordered are listed, but only abnormal results are displayed) Labs Reviewed  POCT INFLUENZA A/B    EKG   Radiology No results found.  Procedures Procedures (including critical care time)  Medications Ordered in UC Medications - No data to display  Initial  Impression / Assessment and Plan / UC Course  I have reviewed the triage vital signs and the nursing notes.  Pertinent labs & imaging results that were available during my care of the patient were reviewed by me and considered in my medical decision making (see chart for details).     Reviewed exam and symptoms with patient.  No red flags.  Negative rapid flu, patient declines COVID testing.  Discussed viral illness and symptomatic treatment.  Refilled albuterol inhaler.  Tessalon as needed for cough.  Will do prednisone for 3 days.  Lots of rest and fluids.  PCP follow-up if symptoms do not improve.  ER precautions reviewed and patient verbalized understanding. Final Clinical Impressions(s) / UC Diagnoses   Final diagnoses:  Viral upper respiratory illness     Discharge Instructions      I have refilled your albuterol inhaler to use as needed for wheezing or shortness of breath.  He may take Tessalon 3 times a day as needed for your cough.  Prednisone daily for 3 days, I would start this tomorrow, 3/8.  Lots of rest and fluids.  Please follow-up with your PCP if your symptoms do not improve.  Please go to the ER for any worsening symptoms.  Hope you feel better soon!     ED Prescriptions     Medication Sig Dispense Auth. Provider   albuterol (VENTOLIN HFA) 108 (90 Base) MCG/ACT inhaler Inhale 1-2 puffs into the lungs every 6 (six) hours as needed. 1 each Radford Pax, NP   predniSONE (DELTASONE) 20 MG tablet Take 2 tablets (40 mg total) by mouth daily with breakfast for 3 days. 6 tablet Radford Pax, NP   benzonatate (TESSALON) 200 MG capsule Take 1 capsule (200 mg total) by mouth 3 (three) times daily as needed. 20 capsule Radford Pax, NP      PDMP not reviewed this encounter.   Radford Pax, NP 10/23/23 [phone]

## 2023-10-23 NOTE — Discharge Instructions (Addendum)
 I have refilled your albuterol inhaler to use as needed for wheezing or shortness of breath.  He may take Tessalon 3 times a day as needed for your cough.  Prednisone daily for 3 days, I would start this tomorrow, 3/8.  Lots of rest and fluids.  Please follow-up with your PCP if your symptoms do not improve.  Please go to the ER for any worsening symptoms.  Hope you feel better soon!

## 2023-10-23 NOTE — ED Triage Notes (Signed)
 Pt presents withc c/o wheezing and coughing x 3 days. Pt state she has gotten worse. Pt has taken abx before, used her inhaler. Reports she has been coughing up spots of blood and has had trouble breathing.

## 2023-10-29 ENCOUNTER — Ambulatory Visit (INDEPENDENT_AMBULATORY_CARE_PROVIDER_SITE_OTHER)

## 2023-10-29 ENCOUNTER — Ambulatory Visit
Admission: EM | Admit: 2023-10-29 | Discharge: 2023-10-29 | Disposition: A | Discharge status: Home / Self Care | Attending: Emergency Medicine | Admitting: Emergency Medicine

## 2023-10-29 DIAGNOSIS — R051 Acute cough: Secondary | ICD-10-CM

## 2023-10-29 MED ORDER — ONDANSETRON 4 MG PO TBDP
4.0000 mg | ORAL_TABLET | Freq: Four times a day (QID) | ORAL | 0 refills | Status: AC | PRN
Start: 2023-10-29 — End: ?

## 2023-10-29 MED ORDER — PROMETHAZINE-DM 6.25-15 MG/5ML PO SYRP: 5.0000 mL | ORAL_SOLUTION | Freq: Four times a day (QID) | ORAL | 0 refills | Status: AC | PRN

## 2023-10-29 MED ORDER — DOXYCYCLINE HYCLATE 100 MG PO CAPS: 100.0000 mg | ORAL_CAPSULE | Freq: Two times a day (BID) | ORAL | 0 refills | Status: AC

## 2023-10-29 NOTE — Discharge Instructions (Addendum)
 Staff will call you tomorrow if there is anything abnormal on xray. On first read by me, it looks good!  I am still treating you for lung infection given the duration of your symptoms and persistence. Take the doxycycline twice daily for 5 days. Always take with food to avoid upset stomach.  I have also sent zofran to use for nausea if needed  The promethazine DM cough syrup can be used up to 4 times daily. If this medication makes you drowsy, take only once before bed.  You can scan the QR code on the last page to get established with a primary care provider.

## 2023-10-29 NOTE — ED Triage Notes (Signed)
 Pt states cough for the past week seen here last week for the same.  States she finished the medications we gave her and that she is still coughing and feeling sob at times.

## 2023-10-29 NOTE — ED Provider Notes (Signed)
 [facility]    CSN: [id number] Arrival date & time: 10/29/23  1914     History   Chief Complaint Chief Complaint  Patient presents with   Cough    HPI Sara Frost is a 33 y.o. female.  Now 9-10 day history of cough, sometimes short of breath Seen one week ago for viral URI, treated with albuterol inhaler, prednisone burst, and tessalon. Maybe mild improvement with medicine temporarily, but otherwise continued symptoms. No new fevers  She did have several episodes of emesis and diarrhea yesterday and today. Had soup and tolerated fluids today.  No new sick exposures since last being seen  Reports current cigarette smoker but cutting down. Her mom had lung cancer  Past Medical History:  Diagnosis Date   Fatty liver     Patient Active Problem List   Diagnosis Date Noted   Supervision of high risk pregnancy, antepartum 08/01/2020   Fatty liver    Hypertension affecting pregnancy    Exostosis 08/30/2019    Past Surgical History:  Procedure Laterality Date   BONE EXOSTOSIS EXCISION Left 09/08/2019   Procedure: LEFT WRIST EXOSTOSIS EXCISION;  Surgeon: Betha Loa, MD;  Location: [facility];  Service: Orthopedics;  Laterality: Left;   CLAVICLE SURGERY     OPEN REDUCTION INTERNAL FIXATION (ORIF) DISTAL RADIAL FRACTURE Left 02/24/2019   Procedure: OPEN REDUCTION INTERNAL FIXATION (ORIF) LEFT DISTAL RADIAL FRACTURE;  Surgeon: Betha Loa, MD;  Location: [facility];  Service: Orthopedics;  Laterality: Left;   WRIST ARTHROSCOPY  09/2019    OB History     Gravida  3   Para  0   Term  0   Preterm  0   AB  2   Living  0      SAB  1   IAB  1   Ectopic  0   Multiple  0   Live Births  0            Home Medications    Prior to Admission medications   Medication Sig Start Date End Date Taking? Authorizing Provider  doxycycline (VIBRAMYCIN) 100 MG capsule Take 1 capsule (100 mg total) by mouth 2 (two) times  daily for 5 days. 10/29/23 11/03/23 Yes Yu Cragun, Lurena Joiner, PA-C  ondansetron (ZOFRAN-ODT) 4 MG disintegrating tablet Take 1 tablet (4 mg total) by mouth every 6 (six) hours as needed for nausea or vomiting. 10/29/23  Yes Jeanpaul Biehl, Lurena Joiner, PA-C  promethazine-dextromethorphan (PROMETHAZINE-DM) 6.25-15 MG/5ML syrup Take 5 mLs by mouth 4 (four) times daily as needed for cough. 10/29/23  Yes Narelle Schoening, Lurena Joiner, PA-C  albuterol (VENTOLIN HFA) 108 (90 Base) MCG/ACT inhaler Inhale 1-2 puffs into the lungs every 6 (six) hours as needed. 10/23/23   Radford Pax, NP  fluticasone (CUTIVATE) 0.05 % cream Apply topically 2 (two) times daily. 10/25/20   Just, Azalee Course, FNP  loratadine (CLARITIN) 10 MG tablet Take 10 mg by mouth daily as needed for allergies.    [provider]  naproxen (NAPROSYN) 500 MG tablet Take 1 tablet (500 mg total) by mouth 2 (two) times daily with a meal. 02/16/23   Wallis Bamberg, PA-C    Family History Family History  Problem Relation Age of Onset   Lung cancer Mother    Liver cancer Maternal Grandmother    Pancreatic cancer Paternal Grandfather     Social History Social History   Tobacco Use   Smoking status: Never   Smokeless tobacco: Never  Vaping Use  Vaping status: Never Used  Substance Use Topics   Alcohol use: Never   Drug use: Yes    Types: Marijuana     Allergies   Penicillins and Vicodin [hydrocodone-acetaminophen]   Review of Systems Review of Systems   Physical Exam Triage Vital Signs ED Triage Vitals  Encounter Vitals Group     BP      Systolic BP Percentile      Diastolic BP Percentile      Pulse      Resp      Temp      Temp src      SpO2      Weight      Height      Head Circumference      Peak Flow      Pain Score      Pain Loc      Pain Education      Exclude from Growth Chart    No data found.  Updated Vital Signs BP (!) 170/100 (BP Location: Right Arm) Comment: regular cuff  Pulse 78   Temp 98 F (36.7 C) (Oral)   Resp 16    LMP 10/17/2023 (Exact Date)   SpO2 100%   Visual Acuity Right Eye Distance:   Left Eye Distance:   Bilateral Distance:    Right Eye Near:   Left Eye Near:    Bilateral Near:     Physical Exam Vitals and nursing note reviewed.  Constitutional:      Appearance: She is not ill-appearing.  HENT:     Right Ear: Tympanic membrane and ear canal normal.     Left Ear: Tympanic membrane and ear canal normal.     Nose: No congestion or rhinorrhea.     Mouth/Throat:     Mouth: Mucous membranes are moist.     Pharynx: Oropharynx is clear. Posterior oropharyngeal erythema present.  Eyes:     Conjunctiva/sclera: Conjunctivae normal.  Cardiovascular:     Rate and Rhythm: Normal rate and regular rhythm.     Pulses: Normal pulses.     Heart sounds: Normal heart sounds.  Pulmonary:     Effort: Pulmonary effort is normal. No respiratory distress.     Breath sounds: Normal breath sounds. No wheezing or rales.  Musculoskeletal:     Cervical back: Normal range of motion.  Lymphadenopathy:     Cervical: Cervical adenopathy (submandibular) present.  Skin:    General: Skin is warm and dry.  Neurological:     Mental Status: She is alert and oriented to person, place, and time.     UC Treatments / Results  Labs (all labs ordered are listed, but only abnormal results are displayed) Labs Reviewed - No data to display  EKG   Radiology DG Chest 2 View Result Date: 10/29/2023 CLINICAL DATA:  Cough and shortness of breath EXAM: CHEST - 2 VIEW COMPARISON:  Chest radiograph 11/06/2019 FINDINGS: The heart size and mediastinal contours are within normal limits. Both lungs are clear. The visualized skeletal structures are unremarkable. IMPRESSION: No active cardiopulmonary disease. Electronically Signed   By: Annia Belt M.D.   On: 10/29/2023 21:46    Procedures Procedures (including critical care time)  Medications Ordered in UC Medications - No data to display  Initial Impression /  Assessment and Plan / UC Course  I have reviewed the triage vital signs and the nursing notes.  Pertinent labs & imaging results that were available during my care of the patient  were reviewed by me and considered in my medical decision making (see chart for details).  BP elevated in clinic today Reassurance provided this is only at one point in time, can follow with a primary care provider to have reassessed. She is also currently sick, and stressed, which is likely increasing BP at this time. Patient reports she will schedule   Afebrile. Clear lungs. Offered chest xray given duration and patient accepts. Imaging is unremarkable  Will go ahead and cover for lung infection with doxycycline BID x 5 days. Have also sent promethazine DM to try. Zofran if needed for nausea. Increase fluids. Strict return and ED precautions are discussed. Patient agrees to plan, all questions answered   Final Clinical Impressions(s) / UC Diagnoses   Final diagnoses:  Acute cough     Discharge Instructions      Staff will call you tomorrow if there is anything abnormal on xray. On first read by me, it looks good!  I am still treating you for lung infection given the duration of your symptoms and persistence. Take the doxycycline twice daily for 5 days. Always take with food to avoid upset stomach.  I have also sent zofran to use for nausea if needed  The promethazine DM cough syrup can be used up to 4 times daily. If this medication makes you drowsy, take only once before bed.  You can scan the QR code on the last page to get established with a primary care provider.     ED Prescriptions     Medication Sig Dispense Auth. Provider   doxycycline (VIBRAMYCIN) 100 MG capsule Take 1 capsule (100 mg total) by mouth 2 (two) times daily for 5 days. 10 capsule Roe Koffman, PA-C   ondansetron (ZOFRAN-ODT) 4 MG disintegrating tablet Take 1 tablet (4 mg total) by mouth every 6 (six) hours as needed for  nausea or vomiting. 20 tablet Tranika Scholler, PA-C   promethazine-dextromethorphan (PROMETHAZINE-DM) 6.25-15 MG/5ML syrup Take 5 mLs by mouth 4 (four) times daily as needed for cough. 240 mL Lyndsie Wallman, Lurena Joiner, PA-C      PDMP not reviewed this encounter.   Sadiq Mccauley, Lurena Joiner, [place] 10/31/23 [phone]
